# Patient Record
Sex: Male | Born: 1940 | Race: Black or African American | Hispanic: No | Marital: Single | Smoking: Never smoker
Health system: Southern US, Community
[De-identification: ages and names within clinical notes are randomized; demographics above are authoritative.]

## PROBLEM LIST (undated history)

## (undated) DIAGNOSIS — E119 Type 2 diabetes mellitus without complications: Secondary | ICD-10-CM

## (undated) HISTORY — DX: Type 2 diabetes mellitus without complications: E11.9

---

## 2001-03-27 ENCOUNTER — Emergency Department (HOSPITAL_COMMUNITY): Admission: EM | Admit: 2001-03-27 | Discharge: 2001-03-27 | Payer: Self-pay | Admitting: Emergency Medicine

## 2013-05-08 ENCOUNTER — Encounter (HOSPITAL_COMMUNITY): Payer: Self-pay | Admitting: Emergency Medicine

## 2013-05-08 ENCOUNTER — Emergency Department (HOSPITAL_COMMUNITY)
Admission: EM | Admit: 2013-05-08 | Discharge: 2013-05-08 | Disposition: A | Discharge status: Home / Self Care | Payer: No Typology Code available for payment source | Attending: Emergency Medicine | Admitting: Emergency Medicine

## 2013-05-08 DIAGNOSIS — T148XXA Other injury of unspecified body region, initial encounter: Secondary | ICD-10-CM

## 2013-05-08 DIAGNOSIS — Z79899 Other long term (current) drug therapy: Secondary | ICD-10-CM | POA: Insufficient documentation

## 2013-05-08 DIAGNOSIS — Y92410 Unspecified street and highway as the place of occurrence of the external cause: Secondary | ICD-10-CM | POA: Insufficient documentation

## 2013-05-08 DIAGNOSIS — Y9389 Activity, other specified: Secondary | ICD-10-CM | POA: Insufficient documentation

## 2013-05-08 DIAGNOSIS — IMO0002 Reserved for concepts with insufficient information to code with codable children: Secondary | ICD-10-CM | POA: Insufficient documentation

## 2013-05-08 DIAGNOSIS — S4980XA Other specified injuries of shoulder and upper arm, unspecified arm, initial encounter: Secondary | ICD-10-CM | POA: Insufficient documentation

## 2013-05-08 DIAGNOSIS — S46909A Unspecified injury of unspecified muscle, fascia and tendon at shoulder and upper arm level, unspecified arm, initial encounter: Secondary | ICD-10-CM | POA: Insufficient documentation

## 2013-05-08 MED ORDER — HYDROCODONE-ACETAMINOPHEN 5-325 MG PO TABS: 2.0000 | ORAL_TABLET | ORAL | Status: DC | PRN

## 2013-05-08 MED ORDER — METHOCARBAMOL 750 MG PO TABS: 750.0000 mg | ORAL_TABLET | Freq: Four times a day (QID) | ORAL | Status: DC
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
## 2013-05-08 NOTE — ED Notes (Signed)
MD at bedside. 
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
## 2015-08-15 ENCOUNTER — Encounter (HOSPITAL_COMMUNITY): Payer: Self-pay | Admitting: Emergency Medicine

## 2015-08-15 ENCOUNTER — Emergency Department (HOSPITAL_COMMUNITY): Payer: Commercial Managed Care - HMO

## 2015-08-15 ENCOUNTER — Emergency Department (HOSPITAL_COMMUNITY)
Admission: EM | Admit: 2015-08-15 | Discharge: 2015-08-15 | Discharge status: Against Medical Advice | Payer: Commercial Managed Care - HMO | Attending: Physician Assistant | Admitting: Physician Assistant

## 2015-08-15 DIAGNOSIS — H02841 Edema of right upper eyelid: Secondary | ICD-10-CM | POA: Diagnosis not present

## 2015-08-15 DIAGNOSIS — H5789 Other specified disorders of eye and adnexa: Secondary | ICD-10-CM

## 2015-08-15 DIAGNOSIS — H02842 Edema of right lower eyelid: Secondary | ICD-10-CM | POA: Diagnosis not present

## 2015-08-15 DIAGNOSIS — R220 Localized swelling, mass and lump, head: Secondary | ICD-10-CM | POA: Diagnosis present

## 2015-08-15 DIAGNOSIS — R609 Edema, unspecified: Secondary | ICD-10-CM

## 2015-08-15 LAB — BASIC METABOLIC PANEL
Anion gap: 8 (ref 5–15)
BUN: 15 mg/dL (ref 6–20)
CALCIUM: 9.1 mg/dL (ref 8.9–10.3)
CO2: 27 mmol/L (ref 22–32)
CREATININE: 1.04 mg/dL (ref 0.61–1.24)
Chloride: 100 mmol/L — ABNORMAL LOW (ref 101–111)
GFR calc non Af Amer: >60 mL/min (ref >60)
GLUCOSE: 303 mg/dL — AB (ref 65–99)
Potassium: 4.7 mmol/L (ref 3.5–5.1)
Sodium: 135 mmol/L (ref 135–145)

## 2015-08-15 LAB — CBC WITH DIFFERENTIAL/PLATELET
Basophils Absolute: 0 10*3/uL (ref 0.0–0.1)
Basophils Relative: 0 %
Eosinophils Absolute: 0.1 10*3/uL (ref 0.0–0.7)
Eosinophils Relative: 1 %
HCT: 38.2 % — ABNORMAL LOW (ref 39.0–52.0)
Hemoglobin: 13.2 g/dL (ref 13.0–17.0)
Lymphocytes Relative: 38 %
Lymphs Abs: 2.9 10*3/uL (ref 0.7–4.0)
MCH: 27.2 pg (ref 26.0–34.0)
MCHC: 34.6 g/dL (ref 30.0–36.0)
MCV: 78.6 fL (ref 78.0–100.0)
Monocytes Absolute: 0.5 10*3/uL (ref 0.1–1.0)
Monocytes Relative: 7 %
Neutro Abs: 4.1 10*3/uL (ref 1.7–7.7)
Neutrophils Relative %: 54 %
Platelets: 270 10*3/uL (ref 150–400)
RBC: 4.86 MIL/uL (ref 4.22–5.81)
RDW: 13.9 % (ref 11.5–15.5)
WBC: 7.6 10*3/uL (ref 4.0–10.5)

## 2015-08-15 MED ORDER — TETRACAINE HCL 0.5 % OP SOLN
1.0000 [drp] | Freq: Once | OPHTHALMIC | Status: AC
  Administered 2015-08-15: 1 [drp] via OPHTHALMIC
  Filled 2015-08-15: qty 4

## 2015-08-15 MED ORDER — FLUORESCEIN SODIUM 1 MG OP STRP
1.0000 | ORAL_STRIP | Freq: Once | OPHTHALMIC | Status: AC
  Administered 2015-08-15: 1 via OPHTHALMIC
  Filled 2015-08-15: qty 1

## 2015-08-15 MED ORDER — POLYMYXIN B-TRIMETHOPRIM 10000-0.1 UNIT/ML-% OP SOLN: 1.0000 [drp] | OPHTHALMIC | Status: DC

## 2015-08-15 MED ORDER — CLINDAMYCIN HCL 150 MG PO CAPS: 450.0000 mg | ORAL_CAPSULE | Freq: Three times a day (TID) | ORAL | Status: DC

## 2015-08-15 NOTE — ED Provider Notes (Signed)
CSN: [id number]     Arrival date & time 08/15/15  1224 History  By signing my name below, I, Tony Russell, attest that this documentation has been prepared under the direction and in the presence of Tony GemmaElizabeth C Westfall, PA-C. Electronically Signed: Doreatha MartinEva Russell, ED Scribe. 08/15/2015. 1:29 PM.      Chief Complaint  Patient presents with  . Facial Swelling    concerned about possible bug bite in r/eye one week ago    The history is provided by the patient. No language interpreter was used.     HPI Comments: Tony BouillonJeff Claw is a 75 y.o. male with no pertinent PMH d/t not being seen by a PCP for 20 years who presents to the Emergency Department complaining of moderate, worsening upper and lower right eyelid swelling onset 2 days ago with associated erythema onset a week ago, sensation of pressure on his upper eyelid. Patient notes that his symptoms began with focal swelling at his tear duct and have worsened since onset. No h/o of similar symptoms. Pt does not wear glasses or contacts. He denies eye pain, vision changes, eye discharge, photophobia, fever, chills.    History reviewed. No pertinent past medical history. History reviewed. No pertinent past surgical history. Family History  Problem Relation Age of Onset  . Cancer Mother   . Hypertension Father    Social History  Substance Use Topics  . Smoking status: Never Smoker   . Smokeless tobacco: None  . Alcohol Use: No      Review of Systems  Constitutional: Negative for fever and chills.  Eyes: Positive for redness. Negative for photophobia, pain, discharge, itching and visual disturbance.       Upper and lower eyelid swellling  All other systems reviewed and are negative.   Allergies  Review of patient's allergies indicates no known allergies.  Home Medications   Prior to Admission medications   Medication Sig Start Date End Date Taking? Authorizing Provider  clindamycin (CLEOCIN) 150 MG capsule Take 3 capsules (450 mg  total) by mouth 3 (three) times daily. Take 450 mg 3 times daily for 10 days 08/15/15   Tony GemmaElizabeth C Westfall, PA-C  trimethoprim-polymyxin b (POLYTRIM) ophthalmic solution Place 1 drop into the right eye every 4 (four) hours. 08/15/15   Dorise HissElizabeth C Westfall, PA-C    BP 179/86 mmHg  Pulse 80  Temp(Src) 98.2 F (36.8 C) (Oral)  Resp 16  SpO2 98% Physical Exam  Constitutional: He is oriented to person, place, and time. He appears well-developed and well-nourished. No distress.  HENT:  Head: Normocephalic and atraumatic.  Right Ear: External ear normal.  Left Ear: External ear normal.  Nose: Nose normal.  Mouth/Throat: Oropharynx is clear and moist.  Eyes: Conjunctivae and EOM are normal. Pupils are equal, round, and reactive to light. Right eye exhibits discharge and exudate. No foreign body present in the right eye. Left eye exhibits no discharge and no exudate. No foreign body present in the left eye. Right conjunctiva is not injected. Right conjunctiva has no hemorrhage. Left conjunctiva is not injected. Left conjunctiva has no hemorrhage. No scleral icterus. Right eye exhibits normal extraocular motion. Left eye exhibits normal extraocular motion.  Slit lamp exam:      The right eye shows no corneal abrasion, no corneal flare, no corneal ulcer, no foreign body and no fluorescein uptake.       The left eye shows no corneal abrasion, no corneal flare, no corneal ulcer, no foreign body and no fluorescein  uptake.  Erythema and edema to medial aspect of right periorbital region and upper and lower eyelid. No significant TTP.  Neck: Normal range of motion. Neck supple.  Cardiovascular: Normal rate and regular rhythm.   Pulmonary/Chest: Effort normal and breath sounds normal. No respiratory distress.  Musculoskeletal: Normal range of motion. He exhibits no edema or tenderness.  Neurological: He is alert and oriented to person, place, and time.  Skin: Skin is warm and dry. He is not diaphoretic.   Psychiatric: He has a normal mood and affect. His behavior is normal.  Nursing note and vitals reviewed.   ED Course  Procedures (including critical care time)  DIAGNOSTIC STUDIES: Oxygen Saturation is 97% on RA, normal by my interpretation.    COORDINATION OF CARE: 1:28 PM Discussed treatment plan with pt at bedside which includes CT orbits, lab work and pt agreed to plan.  Labs Review Results for orders placed or performed during the hospital encounter of 08/15/15  CBC with Differential  Result Value Ref Range   WBC 7.6 4.0 - 10.5 K/uL   RBC 4.86 4.22 - 5.81 MIL/uL   Hemoglobin 13.2 13.0 - 17.0 g/dL   HCT 21.3 (L) 08.6 - 57.8 %   MCV 78.6 78.0 - 100.0 fL   MCH 27.2 26.0 - 34.0 pg   MCHC 34.6 30.0 - 36.0 g/dL   RDW 46.9 62.9 - 52.8 %   Platelets 270 150 - 400 K/uL   Neutrophils Relative % 54 %   Neutro Abs 4.1 1.7 - 7.7 K/uL   Lymphocytes Relative 38 %   Lymphs Abs 2.9 0.7 - 4.0 K/uL   Monocytes Relative 7 %   Monocytes Absolute 0.5 0.1 - 1.0 K/uL   Eosinophils Relative 1 %   Eosinophils Absolute 0.1 0.0 - 0.7 K/uL   Basophils Relative 0 %   Basophils Absolute 0.0 0.0 - 0.1 K/uL  Basic metabolic panel  Result Value Ref Range   Sodium 135 135 - 145 mmol/L   Potassium 4.7 3.5 - 5.1 mmol/L   Chloride 100 (L) 101 - 111 mmol/L   CO2 27 22 - 32 mmol/L   Glucose, Bld 303 (H) 65 - 99 mg/dL   BUN 15 6 - 20 mg/dL   Creatinine, Ser 4.13 0.61 - 1.24 mg/dL   Calcium 9.1 8.9 - 24.4 mg/dL   GFR calc non Af Amer >60 >60 mL/min   GFR calc Af Amer >60 >60 mL/min   Anion gap 8 5 - 15     Imaging Review No results found.    I have personally reviewed and evaluated these lab results as part of my medical decision-making.   MDM   Final diagnoses:  Swelling of right eye   75 year old male presents with right eyelid swelling. Notes his symptoms have been present for the past week. Patient is afebrile. Vital signs stable. On exam, patient has significant edema and erythema to  his right upper and lower eyelid. PERRL. EOMs intact. No conjunctival injection. No increased fluorescein uptake on exam. Patient discussed with and seen by Dr. Corlis Leak. Will obtain basic labs as well as CT of orbits for further evaluation of symptoms. CBC negative for leukocytosis or anemia. Creatinine within normal limits.  Notified by nurse that patient is refusing his CT and states he has to go to work. On my assessment, patient continues to refuse CT despite discussing importance of obtaining this imaging. Spoke with patient at length regarding risks of not obtaining CT, which includes loss  of vision and worsening infection. Patient states he understands the risks of leaving and continues to express his desire to leave the ED AMA. Will treat with oral abx as well as eyedrops. Patient to follow-up with ophthalmologist. Strict return precautions discussed. Patient verbalizes his understanding. BP 179/86 mmHg  Pulse 80  Temp(Src) 98.2 F (36.8 C) (Oral)  Resp 16  SpO2 98%   I personally performed the services described in this documentation, which was scribed in my presence. The recorded information has been reviewed and is accurate.   Tony Gemma, PA-C 08/15/15 1748  Courteney Randall An, MD 08/16/15 1649

## 2015-08-15 NOTE — Discharge Instructions (Signed)
1. Medications: clindamycin, polytrim eye drops, usual home medications 2. Treatment: rest, drink plenty of fluids 3. Follow Up: please followup with your ophthalmologist in 2-3 days for discussion of your diagnoses and further evaluation after today's visit; if you do not have a primary care doctor use the phone number listed in your discharge paperwork to find one; please return to the ER for increased pain, vision loss, high fever, new or worsening symptoms

## 2015-08-15 NOTE — ED Notes (Signed)
Patient states he is unable to wait for CT scan and has to go to work.  PA notified.

## 2017-01-19 ENCOUNTER — Encounter (HOSPITAL_COMMUNITY): Payer: Self-pay | Admitting: Emergency Medicine

## 2017-01-19 ENCOUNTER — Emergency Department (HOSPITAL_COMMUNITY)
Admission: EM | Admit: 2017-01-19 | Discharge: 2017-01-19 | Disposition: A | Discharge status: Home / Self Care | Payer: Medicare HMO | Attending: Emergency Medicine | Admitting: Emergency Medicine

## 2017-01-19 ENCOUNTER — Emergency Department (HOSPITAL_COMMUNITY): Payer: Medicare HMO

## 2017-01-19 DIAGNOSIS — M7918 Myalgia, other site: Secondary | ICD-10-CM

## 2017-01-19 DIAGNOSIS — M545 Low back pain: Secondary | ICD-10-CM | POA: Insufficient documentation

## 2017-01-19 DIAGNOSIS — Z041 Encounter for examination and observation following transport accident: Secondary | ICD-10-CM | POA: Diagnosis not present

## 2017-01-19 NOTE — Discharge Instructions (Signed)
Return if any problems. See your Physician for recheck if pain persist  
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
## 2017-01-19 NOTE — ED Notes (Signed)
PT states understanding of care given. PT ambulated from ED to car with a steady gait. 
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
## 2017-01-19 NOTE — ED Notes (Signed)
Patient is A&Ox4.  No signs of distress noted.  Please see providers complete history and physical exam.  

## 2018-10-05 DIAGNOSIS — E669 Obesity, unspecified: Secondary | ICD-10-CM | POA: Diagnosis not present

## 2018-10-05 DIAGNOSIS — Z833 Family history of diabetes mellitus: Secondary | ICD-10-CM | POA: Diagnosis not present

## 2018-10-05 DIAGNOSIS — Z8249 Family history of ischemic heart disease and other diseases of the circulatory system: Secondary | ICD-10-CM | POA: Diagnosis not present

## 2018-10-05 DIAGNOSIS — Z809 Family history of malignant neoplasm, unspecified: Secondary | ICD-10-CM | POA: Diagnosis not present

## 2018-10-05 DIAGNOSIS — Z87891 Personal history of nicotine dependence: Secondary | ICD-10-CM | POA: Diagnosis not present

## 2019-06-10 ENCOUNTER — Emergency Department (HOSPITAL_COMMUNITY): Payer: Medicare HMO

## 2019-06-10 ENCOUNTER — Inpatient Hospital Stay (HOSPITAL_COMMUNITY)
Admission: EM | Admit: 2019-06-10 | Discharge: 2019-06-14 | DRG: 177 | Disposition: A | Discharge status: Home / Self Care | Payer: Medicare HMO | Attending: Family Medicine | Admitting: Family Medicine

## 2019-06-10 ENCOUNTER — Encounter (HOSPITAL_COMMUNITY): Payer: Self-pay

## 2019-06-10 ENCOUNTER — Other Ambulatory Visit: Payer: Self-pay

## 2019-06-10 DIAGNOSIS — J9601 Acute respiratory failure with hypoxia: Secondary | ICD-10-CM | POA: Diagnosis present

## 2019-06-10 DIAGNOSIS — I444 Left anterior fascicular block: Secondary | ICD-10-CM | POA: Diagnosis present

## 2019-06-10 DIAGNOSIS — Z8249 Family history of ischemic heart disease and other diseases of the circulatory system: Secondary | ICD-10-CM

## 2019-06-10 DIAGNOSIS — U071 COVID-19: Secondary | ICD-10-CM | POA: Diagnosis present

## 2019-06-10 DIAGNOSIS — J1282 Pneumonia due to coronavirus disease 2019: Secondary | ICD-10-CM | POA: Diagnosis present

## 2019-06-10 DIAGNOSIS — E663 Overweight: Secondary | ICD-10-CM | POA: Diagnosis present

## 2019-06-10 DIAGNOSIS — J9602 Acute respiratory failure with hypercapnia: Secondary | ICD-10-CM | POA: Diagnosis present

## 2019-06-10 DIAGNOSIS — E871 Hypo-osmolality and hyponatremia: Secondary | ICD-10-CM | POA: Diagnosis present

## 2019-06-10 DIAGNOSIS — Z6838 Body mass index (BMI) 38.0-38.9, adult: Secondary | ICD-10-CM | POA: Diagnosis not present

## 2019-06-10 DIAGNOSIS — E1165 Type 2 diabetes mellitus with hyperglycemia: Secondary | ICD-10-CM | POA: Diagnosis present

## 2019-06-10 LAB — COMPREHENSIVE METABOLIC PANEL
ALT: 20 U/L (ref 0–44)
AST: 26 U/L (ref 15–41)
Albumin: 3.3 g/dL — ABNORMAL LOW (ref 3.5–5.0)
Alkaline Phosphatase: 68 U/L (ref 38–126)
Anion gap: 15 (ref 5–15)
BUN: 16 mg/dL (ref 8–23)
CO2: 22 mmol/L (ref 22–32)
Calcium: 8.2 mg/dL — ABNORMAL LOW (ref 8.9–10.3)
Chloride: 90 mmol/L — ABNORMAL LOW (ref 98–111)
Creatinine, Ser: 1.12 mg/dL (ref 0.61–1.24)
GFR calc Af Amer: >60 mL/min (ref >60)
GFR calc non Af Amer: >60 mL/min (ref >60)
Glucose, Bld: 311 mg/dL — ABNORMAL HIGH (ref 70–99)
Potassium: 4.1 mmol/L (ref 3.5–5.1)
Sodium: 127 mmol/L — ABNORMAL LOW (ref 135–145)
Total Bilirubin: 0.7 mg/dL (ref 0.3–1.2)
Total Protein: 7.5 g/dL (ref 6.5–8.1)

## 2019-06-10 LAB — CBC WITH DIFFERENTIAL/PLATELET
Abs Immature Granulocytes: 0.02 10*3/uL (ref 0.00–0.07)
Basophils Absolute: 0 10*3/uL (ref 0.0–0.1)
Basophils Relative: 0 %
Eosinophils Absolute: 0 10*3/uL (ref 0.0–0.5)
Eosinophils Relative: 0 %
HCT: 39.7 % (ref 39.0–52.0)
Hemoglobin: 13.3 g/dL (ref 13.0–17.0)
Immature Granulocytes: 0 %
Lymphocytes Relative: 27 %
Lymphs Abs: 1.4 10*3/uL (ref 0.7–4.0)
MCH: 26.7 pg (ref 26.0–34.0)
MCHC: 33.5 g/dL (ref 30.0–36.0)
MCV: 79.7 fL — ABNORMAL LOW (ref 80.0–100.0)
Monocytes Absolute: 0.4 10*3/uL (ref 0.1–1.0)
Monocytes Relative: 8 %
Neutro Abs: 3.5 10*3/uL (ref 1.7–7.7)
Neutrophils Relative %: 65 %
Platelets: 242 10*3/uL (ref 150–400)
RBC: 4.98 MIL/uL (ref 4.22–5.81)
RDW: 14.1 % (ref 11.5–15.5)
WBC: 5.4 10*3/uL (ref 4.0–10.5)
nRBC: 0 % (ref 0.0–0.2)

## 2019-06-10 LAB — D-DIMER, QUANTITATIVE: D-Dimer, Quant: 1.02 ug/mL-FEU — ABNORMAL HIGH (ref 0.00–0.50)

## 2019-06-10 LAB — LACTIC ACID, PLASMA: Lactic Acid, Venous: 1.7 mmol/L (ref 0.5–1.9)

## 2019-06-10 LAB — TRIGLYCERIDES: Triglycerides: 177 mg/dL — ABNORMAL HIGH (ref <150)

## 2019-06-10 LAB — ABO/RH: ABO/RH(D): AB POS

## 2019-06-10 LAB — LACTATE DEHYDROGENASE: LDH: 305 U/L — ABNORMAL HIGH (ref 98–192)

## 2019-06-10 LAB — PROCALCITONIN: Procalcitonin: 0.23 ng/mL

## 2019-06-10 LAB — FIBRINOGEN: Fibrinogen: 486 mg/dL — ABNORMAL HIGH (ref 210–475)

## 2019-06-10 LAB — POC SARS CORONAVIRUS 2 AG -  ED: SARS Coronavirus 2 Ag: POSITIVE — AB

## 2019-06-10 MED ORDER — ALBUTEROL SULFATE HFA 108 (90 BASE) MCG/ACT IN AERS
2.0000 | INHALATION_SPRAY | Freq: Four times a day (QID) | RESPIRATORY_TRACT | Status: DC
  Administered 2019-06-10 – 2019-06-14 (×16): 2 via RESPIRATORY_TRACT
  Filled 2019-06-10 (×4): qty 6.7

## 2019-06-10 MED ORDER — ACETAMINOPHEN 325 MG PO TABS: 650.0000 mg | ORAL_TABLET | Freq: Four times a day (QID) | ORAL | Status: DC | PRN

## 2019-06-10 MED ORDER — ONDANSETRON HCL 4 MG/2ML IJ SOLN: 4.0000 mg | Freq: Four times a day (QID) | INTRAMUSCULAR | Status: DC | PRN

## 2019-06-10 MED ORDER — SODIUM CHLORIDE 0.9 % IV SOLN
100.0000 mg | Freq: Every day | INTRAVENOUS | Status: AC
  Administered 2019-06-11 – 2019-06-14 (×4): 100 mg via INTRAVENOUS
  Filled 2019-06-10 (×3): qty 100
  Filled 2019-06-10: qty 20

## 2019-06-10 MED ORDER — ENOXAPARIN SODIUM 60 MG/0.6ML ~~LOC~~ SOLN
55.0000 mg | SUBCUTANEOUS | Status: DC
  Administered 2019-06-11 – 2019-06-13 (×4): 55 mg via SUBCUTANEOUS
  Filled 2019-06-10: qty 0.55
  Filled 2019-06-10 (×3): qty 0.6
  Filled 2019-06-10 (×2): qty 0.55

## 2019-06-10 MED ORDER — SODIUM CHLORIDE 0.9 % IV SOLN
200.0000 mg | Freq: Once | INTRAVENOUS | Status: AC
  Administered 2019-06-10: 200 mg via INTRAVENOUS
  Filled 2019-06-10: qty 200

## 2019-06-10 MED ORDER — ONDANSETRON HCL 4 MG PO TABS: 4.0000 mg | ORAL_TABLET | Freq: Four times a day (QID) | ORAL | Status: DC | PRN

## 2019-06-10 MED ORDER — SORBITOL 70 % SOLN
30.0000 mL | Freq: Every day | Status: DC | PRN
  Filled 2019-06-10: qty 30

## 2019-06-10 MED ORDER — IOHEXOL 350 MG/ML SOLN
100.0000 mL | Freq: Once | INTRAVENOUS | Status: AC | PRN
  Administered 2019-06-10: 100 mL via INTRAVENOUS

## 2019-06-10 MED ORDER — ACETAMINOPHEN 650 MG RE SUPP: 650.0000 mg | Freq: Four times a day (QID) | RECTAL | Status: DC | PRN

## 2019-06-10 MED ORDER — ZINC SULFATE 220 (50 ZN) MG PO CAPS
220.0000 mg | ORAL_CAPSULE | Freq: Every day | ORAL | Status: DC
  Administered 2019-06-10 – 2019-06-14 (×5): 220 mg via ORAL
  Filled 2019-06-10 (×5): qty 1

## 2019-06-10 MED ORDER — ASCORBIC ACID 500 MG PO TABS
500.0000 mg | ORAL_TABLET | Freq: Every day | ORAL | Status: DC
  Administered 2019-06-10 – 2019-06-14 (×5): 500 mg via ORAL
  Filled 2019-06-10 (×6): qty 1

## 2019-06-10 MED ORDER — HYDROCOD POLST-CPM POLST ER 10-8 MG/5ML PO SUER
5.0000 mL | Freq: Two times a day (BID) | ORAL | Status: DC | PRN
  Administered 2019-06-13: 5 mL via ORAL
  Filled 2019-06-10: qty 5

## 2019-06-10 MED ORDER — FLEET ENEMA 7-19 GM/118ML RE ENEM: 1.0000 | ENEMA | Freq: Once | RECTAL | Status: DC | PRN

## 2019-06-10 MED ORDER — MAGNESIUM HYDROXIDE 400 MG/5ML PO SUSP: 30.0000 mL | Freq: Every day | ORAL | Status: DC | PRN

## 2019-06-10 MED ORDER — SODIUM CHLORIDE 0.9% FLUSH
3.0000 mL | Freq: Two times a day (BID) | INTRAVENOUS | Status: DC
  Administered 2019-06-11 – 2019-06-14 (×8): 3 mL via INTRAVENOUS

## 2019-06-10 MED ORDER — DEXAMETHASONE SODIUM PHOSPHATE 10 MG/ML IJ SOLN
6.0000 mg | INTRAMUSCULAR | Status: DC
  Administered 2019-06-10 – 2019-06-13 (×4): 6 mg via INTRAVENOUS
  Filled 2019-06-10 (×5): qty 1

## 2019-06-10 MED ORDER — GUAIFENESIN-DM 100-10 MG/5ML PO SYRP
10.0000 mL | ORAL_SOLUTION | ORAL | Status: DC | PRN
  Administered 2019-06-13 (×2): 10 mL via ORAL
  Filled 2019-06-10 (×2): qty 10

## 2019-06-10 MED ORDER — SENNA 8.6 MG PO TABS
1.0000 | ORAL_TABLET | Freq: Two times a day (BID) | ORAL | Status: DC
  Administered 2019-06-11 – 2019-06-14 (×7): 8.6 mg via ORAL
  Filled 2019-06-10 (×8): qty 1

## 2019-06-10 MED ORDER — ENOXAPARIN SODIUM 40 MG/0.4ML ~~LOC~~ SOLN: 40.0000 mg | SUBCUTANEOUS | Status: DC

## 2019-06-10 MED ORDER — SODIUM CHLORIDE (PF) 0.9 % IJ SOLN
INTRAMUSCULAR | Status: AC
  Filled 2019-06-10: qty 50

## 2019-06-10 NOTE — ED Notes (Signed)
Upon rounding on pt, pt found covered in BM. Pt states he was unaware he had a bowel movement. Pt cleaned and provided with peri care. Pt has call bell, room phone and cell phone within reach. Pt states he is going to call his family who called while pt was being cleaned.

## 2019-06-10 NOTE — Progress Notes (Signed)
PHARMACY - LOVENOX  Lovenox per Pharmacy for DVT Prophylaxis  Pharmacy has been consulted from dosing enoxaparin (lovenox) in this patient for DVT prophylaxis.     COVID +  H/H = 13.3/39.7  PLTC = 242  BMI = 38  Assessment:  BMI > 35 therefore will adjust Lovenox dose per guideline to 0.5 mg/kg daily  Plan: Lovenox 55mg  sq q24h  Thank you , PharmD

## 2019-06-10 NOTE — ED Notes (Signed)
Pt cleaned up by staff members. Pt stated he was unaware that he had a loose bowel movement. Bed is in locked and lowest position. Call bell within reach.

## 2019-06-10 NOTE — H&P (Signed)
[facility] History and Physical  Tony Mcpartland Winkels Jr. [id number]:[id number] DOB: Dec 01, 1940 DOA: 06/10/2019 Referring physician: ED PCP: Patient, No Pcp Per  Chief Complaint: Cough, fatigue Came from: Home ------------------------------------------------------------------------------------------------------ Assessment/Plan: Active Problems:   Pneumonia due to COVID-19 virus  COVID pneumonia Acute respiratory failure with hypoxia - 2 lpm -Presented with cough, fatigue -Chest imaging -CT chest showed bibasilar opacities -COVID-19 positive -WBC and inflammatory markers as below. -Treatment: Started on Decadron 6 mg daily for 10 days, IV Remdesivir for 5 days to complete on 1/9, -Supportive care: Vitamin C, Zinc, inhalers, Tylenol, Antitussives - benzonatate, Mucinex -Oxygen - SpO2: 99 % O2 Flow Rate (L/min): 3 L/min -Continue airborne/contact isolation precautions.  Recent Labs  Lab 06/10/19 1320  WBC 5.4   Recent Labs    06/10/19 1320  DDIMER 1.02*  LDH 305*    Hyponatremia -Sodium low at 127.  Patient denies any past medical history.  Last labs from 2017 shows sodium level normal at 135. -Likely from poor hydration and oral intake.  Encourage oral hydration intake.  Repeat sodium tomorrow.  If not improving, will start on IV fluids.  Mobility: Independent.  Increase ambulation Diet: Regular diet DVT prophylaxis:  Lovenox Code Status:  Full code Family Communication:  Patient states he will update the family Disposition Plan:  Inpatient admission.  If improves, an early discharge to follow-up at remdesivir clinic may be appropriate.  ----------------------------------------------------------------------------------------------------- History of Present Illness: Tony Dines. is a 79 y.o. male with no significant past medical history who presented to the ED with cough, fatigue.  Patient works as a Scientist, [place] and recalls being  exposed to a patient with Covid 10 days ago.  Started having cough few days after the exposure.  It is dry and 'deep-seated'.  He also started having generalized body aches and fatigue which has been progressing.   In the ED, patient was afebrile, hemodynamically stable.  He was initially maintained oxygen saturation more than 90% on room air at rest.  On minimal ambulation, patient started to drop oxygen saturation to 80s requiring supplemental oxygen.  At the time of my evaluation, patient is on 2 L oxygen to maintain saturation more than 90%. Work-up showed WBC count normal at 5.4, sodium level low at 127, BUN/creatinine normal. Lactic acid normal 1.7, procalcitonin slightly elevated to 0.23 COVID-19 antigen test positive. Serum inflammatory markers elevated: LDH 305, D-dimer 1.02, fibrinogen 486. Chest x-ray normal CT angio of chest did not show any evidence of pulmonary embolism but showed bilateral groundglass opacities.  Past medical history: History reviewed. No pertinent past medical history.  Past surgical history: History reviewed. No pertinent surgical history.  Social History:  reports that he has never smoked. He has never used smokeless tobacco. He reports that he does not drink alcohol or use drugs.  Allergies:  No Known Allergies  Family history:  Family History  Problem Relation Age of Onset  . Cancer Mother   . Hypertension Father      Home Meds: Prior to Admission medications   Medication Sig Start Date End Date Taking? Authorizing Provider  clindamycin (CLEOCIN) 150 MG capsule Take 3 capsules (450 mg total) by mouth 3 (three) times daily. Take 450 mg 3 times daily for 10 days Patient not taking: Reported on 06/10/2019 08/15/15   Mady Gemma, PA-C  trimethoprim-polymyxin b (POLYTRIM) ophthalmic solution Place 1 drop into the right eye every 4 (four) hours. Patient not taking: Reported on 01/19/2017 08/15/15  Mady Gemma, [place]    Physical  Exam: Vitals:   06/10/19 1300 06/10/19 1358 06/10/19 1543 06/10/19 1600  BP: 127/75 138/69 (!) 150/98 (!) 115/93  Pulse: 95 93 95 99  Resp: (!) 26 (!) 24 (!) 38 (!) 38  Temp:      TempSrc:      SpO2: 92% 97% 99% 99%  Weight:      Height:       Wt Readings from Last 3 Encounters:  06/10/19 113.4 kg  01/19/17 119.6 kg  05/08/13 102.1 kg   Body mass index is 38.01 kg/m.  General exam: Appears calm and comfortable.  Skin: No rashes, lesions or ulcers. HEENT: Atraumatic, normocephalic, supple neck, no obvious bleeding Lungs: Clear to auscultation bilaterally CVS: Regular rate and rhythm, no murmur GI/Abd soft, nontender, nondistended, bowel sound present CNS: Alert, awake, oriented x3 Psychiatry: Mood appropriate Extremities: No pedal edema, no calf tenderness     Consult Orders  (From admission, onward)         Start     Ordered   06/10/19 1553  Consult to hospitalist  ALL PATIENTS BEING ADMITTED/HAVING PROCEDURES NEED COVID-19 SCREENING  Once    Comments: ALL PATIENTS BEING ADMITTED/HAVING PROCEDURES NEED COVID-19 SCREENING  Provider:  (Not yet assigned)  Question Answer Comment  Place call to: [facility]   Reason for Consult Admit      06/10/19 1552          Labs on Admission:   CBC: Recent Labs  Lab 06/10/19 1320  WBC 5.4  NEUTROABS 3.5  HGB 13.3  HCT 39.7  MCV 79.7*  PLT 242    Basic Metabolic Panel: Recent Labs  Lab 06/10/19 1320  NA 127*  K 4.1  CL 90*  CO2 22  GLUCOSE 311*  BUN 16  CREATININE 1.12  CALCIUM 8.2*    Liver Function Tests: Recent Labs  Lab 06/10/19 1320  AST 26  ALT 20  ALKPHOS 68  BILITOT 0.7  PROT 7.5  ALBUMIN 3.3*   No results for input(s): LIPASE, AMYLASE in the last 168 hours. No results for input(s): AMMONIA in the last 168 hours.  Cardiac Enzymes: No results for input(s): CKTOTAL, CKMB, CKMBINDEX, TROPONINI in the last 168 hours.  BNP (last 3 results) No results for input(s): BNP in the last  8760 hours.  ProBNP (last 3 results) No results for input(s): PROBNP in the last 8760 hours.  CBG: No results for input(s): GLUCAP in the last 168 hours.  Lipase  No results found for: LIPASE   Urinalysis No results found for: COLORURINE, APPEARANCEUR, LABSPEC, PHURINE, GLUCOSEU, HGBUR, BILIRUBINUR, KETONESUR, PROTEINUR, UROBILINOGEN, NITRITE, LEUKOCYTESUR   Drugs of Abuse  No results found for: LABOPIA, COCAINSCRNUR, LABBENZ, AMPHETMU, THCU, LABBARB    Radiological Exams on Admission: CT Angio Chest PE W and/or Wo Contrast  Result Date: 06/10/2019 CLINICAL DATA:  Shortness of breath and cough, history of COVID-19 positivity EXAM: CT ANGIOGRAPHY CHEST WITH CONTRAST TECHNIQUE: Multidetector CT imaging of the chest was performed using the standard protocol during bolus administration of intravenous contrast. Multiplanar CT image reconstructions and MIPs were obtained to evaluate the vascular anatomy. CONTRAST:  OMNIPAQUE IOHEXOL 350 MG/ML SOLN COMPARISON:  Chest x-ray from earlier in the same day. FINDINGS: Cardiovascular: Thoracic aorta demonstrates atherosclerotic calcifications without aneurysmal dilatation or dissection. No cardiac enlargement is seen. The pulmonary artery shows a normal branching pattern. No definitive filling defect to suggest pulmonary embolism is seen although peripheral opacification in the  lower lobes is somewhat limited. Mediastinum/Nodes: Thoracic inlet is within normal limits. No sizable hilar or mediastinal adenopathy is noted. The esophagus as visualized shows a small sliding-type hiatal hernia. No other focal abnormality is seen. Lungs/Pleura: Calcified granulomas are seen. Additionally some peripheral ground-glass opacities are noted within both lungs consistent with the given clinical history. No sizable effusion or pneumothorax is noted. No sizable parenchymal nodules are seen. Upper Abdomen: Visualized upper abdomen is within normal limits. Musculoskeletal:  Mild degenerative changes of the thoracic spine are noted. Review of the MIP images confirms the above findings. IMPRESSION: Bilateral ground-glass opacities consistent with the given clinical history of COVID-19 positivity. No evidence of pulmonary emboli. Electronically Signed   By: Inez Catalina M.D.   On: 06/10/2019 15:40   DG Chest Portable 1 View  Result Date: 06/10/2019 CLINICAL DATA:  Shortness of breath. Additional history provided: Generalized body aches and fatigue on Friday, now coughing with shortness of breath. EXAM: PORTABLE CHEST 1 VIEW COMPARISON:  No pertinent prior studies available for comparison. FINDINGS: Heart size within normal limits. No airspace consolidation is identified. No evidence of pleural effusion or pneumothorax. No acute bony abnormality. Overlying cardiac monitoring leads. IMPRESSION: No evidence of acute cardiopulmonary abnormality. Electronically Signed   By: Kellie Simmering DO   On: 06/10/2019 13:49   ----------------------------------------------------------------------------------------------------------------------------------------------------------- Severity of Illness: The appropriate patient status for this patient is INPATIENT. Inpatient status is judged to be reasonable and necessary in order to provide the required intensity of service to ensure the patient's safety. The patient's presenting symptoms, physical exam findings, and initial radiographic and laboratory data in the context of their chronic comorbidities is felt to place them at high risk for further clinical deterioration. Furthermore, it is not anticipated that the patient will be medically stable for discharge from the hospital within 2 midnights of admission. The following factors support the patient status of inpatient.   " The patient's presenting symptoms include cough, fatigue. " The worrisome physical exam findings include hypoxia. " The initial radiographic and laboratory data are worrisome  because of bilateral opacities, Covid positive. " The chronic co-morbidities include all days.   * I certify that at the point of admission it is my clinical judgment that the patient will require inpatient hospital care spanning beyond 2 midnights from the point of admission due to high intensity of service, high risk for further deterioration and high frequency of surveillance required.*   Signed, Terrilee Croak, MD [facility] 06/10/2019

## 2019-06-10 NOTE — ED Notes (Signed)
Pt called out stating he felt like he had another BM. Upon entering room, pt found attempting to get out of the bed to use the bathroom. Pt repositioned in the bed and offered toileting, pt denied needing to have a BM at this time. Pt now has fall alarm pad underneath him due to pt attempting to get out of bed without assist. Pt has call bell within reach and encouraged to use it if assistance is needed.

## 2019-06-10 NOTE — ED Triage Notes (Signed)
Pt reports he started having generalized body aches and fatigue on Friday. Pt is now coughing and SHOB. Pt reports getting a flu vaccine on Monday as well. Pt [place] so he thinks he was exposed at work.

## 2019-06-10 NOTE — ED Provider Notes (Signed)
[facility]-EMERGENCY DEPT Provider Note   CSN: [id number] Arrival date & time: 06/10/19  1222     History Chief Complaint  Patient presents with  . Generalized Body Aches  . Cough  . Shortness of Breath    Tony Russell. is a 79 y.o. male with no significant past medical history who presents to the ED due to gradual onset of worsening generalized body aches and fatigue that started on Friday. 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 He notes he developed a dry cough and chills on Monday. Patient notes he received his flu vaccine on Monday. Patient denies fever, shortness of breath, and chest pain. Patient has tried Tylenol with moderate relief. Patient denies lower extremity edema, nausea, vomiting and diarrhea. He admits to intermittent abdominal pain only while coughing. Patient denies known COVID exposures or sick contacts.      History reviewed. No pertinent past medical history.  Patient Active Problem List   Diagnosis Date Noted  . Pneumonia due to COVID-19 virus 06/10/2019    History reviewed. No pertinent surgical history.     Family History  Problem Relation Age of Onset  . Cancer Mother   . Hypertension Father     Social History   Tobacco Use  . Smoking status: Never Smoker  . Smokeless tobacco: Never Used  Substance Use Topics  . Alcohol use: No  . Drug use: No    Frequency: 3.0 times per week    Home Medications Prior to Admission medications   Medication Sig Start Date End Date Taking? Authorizing Provider  clindamycin (CLEOCIN) 150 MG capsule Take 3 capsules (450 mg total) by mouth 3 (three) times daily. Take 450 mg 3 times daily for 10 days Patient not taking: Reported on 06/10/2019 08/15/15   Mady Gemma, PA-C  trimethoprim-polymyxin b (POLYTRIM) ophthalmic solution Place 1 drop into the right eye every 4 (four) hours. Patient not taking: Reported on 01/19/2017  08/15/15   Mady Gemma, PA-C    Allergies    Patient has no known allergies.  Review of Systems   Review of Systems  Constitutional: Positive for chills and fatigue. Negative for fever.  HENT: Negative for congestion and sore throat.   Respiratory: Positive for cough. Negative for shortness of breath.   Cardiovascular: Negative for chest pain and leg swelling.  Gastrointestinal: Positive for abdominal pain (when coughing). Negative for constipation, diarrhea, nausea and vomiting.  Musculoskeletal: Positive for myalgias.  Neurological: Positive for light-headedness (when ambulating). Negative for syncope, weakness and headaches.  All other systems reviewed and are negative.   Physical Exam Updated Vital Signs BP (!) 141/78   Pulse 99   Temp 99 F (37.2 C) (Oral)   Resp 16   Ht 5\' 8"  (1.727 m)   Wt 113.4 kg   SpO2 94%   BMI 38.01 kg/m   Physical Exam Vitals and nursing note reviewed.  Constitutional:      General: He is not in acute distress.    Appearance: He is ill-appearing.  HENT:     Head: Normocephalic.     Mouth/Throat:     Pharynx: No oropharyngeal exudate or posterior oropharyngeal erythema.     Comments: Dry mucus membranes.  Eyes:     Conjunctiva/sclera: Conjunctivae normal.     Pupils: Pupils are equal, round, and reactive to light.  Neck:     Comments: No meningismus. Cardiovascular:  Rate and Rhythm: Normal rate and regular rhythm.     Pulses: Normal pulses.     Heart sounds: Normal heart sounds. No murmur. No friction rub. No gallop.   Pulmonary:     Effort: Pulmonary effort is normal.     Breath sounds: Normal breath sounds.     Comments: Respirations equal with mild increased work of breathing, patient able to speak in full sentences, lungs clear to auscultation bilaterally with mild decreased air movement at base of lungs. Patient dropped down to 86% O2 saturation after ambulation. Patient placed on 2L [place] Abdominal:     General:  Abdomen is flat. Bowel sounds are normal. There is no distension.     Palpations: Abdomen is soft.  Musculoskeletal:     Cervical back: Neck supple.     Right lower leg: No edema.     Left lower leg: No edema.     Comments: Patient able to move all 4 extremities without difficulty. No lower extremity edema.   Skin:    General: Skin is warm.  Neurological:     General: No focal deficit present.     Mental Status: He is alert.     ED Results / Procedures / Treatments   Labs (all labs ordered are listed, but only abnormal results are displayed) Labs Reviewed  CBC WITH DIFFERENTIAL/PLATELET - Abnormal; Notable for the following components:      Result Value   MCV 79.7 (*)    All other components within normal limits  COMPREHENSIVE METABOLIC PANEL - Abnormal; Notable for the following components:   Sodium 127 (*)    Chloride 90 (*)    Glucose, Bld 311 (*)    Calcium 8.2 (*)    Albumin 3.3 (*)    All other components within normal limits  D-DIMER, QUANTITATIVE (NOT AT [facility]) - Abnormal; Notable for the following components:   D-Dimer, Quant 1.02 (*)    All other components within normal limits  LACTATE DEHYDROGENASE - Abnormal; Notable for the following components:   LDH 305 (*)    All other components within normal limits  TRIGLYCERIDES - Abnormal; Notable for the following components:   Triglycerides 177 (*)    All other components within normal limits  FIBRINOGEN - Abnormal; Notable for the following components:   Fibrinogen 486 (*)    All other components within normal limits  POC SARS CORONAVIRUS 2 AG -  ED - Abnormal; Notable for the following components:   SARS Coronavirus 2 Ag POSITIVE (*)    All other components within normal limits  CULTURE, BLOOD (ROUTINE X 2)  CULTURE, BLOOD (ROUTINE X 2)  LACTIC ACID, PLASMA  PROCALCITONIN  FERRITIN  C-REACTIVE PROTEIN  BASIC METABOLIC PANEL  CBC  D-DIMER, QUANTITATIVE (NOT AT [facility])  FERRITIN  C-REACTIVE PROTEIN  ABO/RH      EKG None  Radiology CT Angio Chest PE W and/or Wo Contrast  Result Date: 06/10/2019 CLINICAL DATA:  Shortness of breath and cough, history of COVID-19 positivity EXAM: CT ANGIOGRAPHY CHEST WITH CONTRAST TECHNIQUE: Multidetector CT imaging of the chest was performed using the standard protocol during bolus administration of intravenous contrast. Multiplanar CT image reconstructions and MIPs were obtained to evaluate the vascular anatomy. CONTRAST:  OMNIPAQUE IOHEXOL 350 MG/ML SOLN COMPARISON:  Chest x-ray from earlier in the same day. FINDINGS: Cardiovascular: Thoracic aorta demonstrates atherosclerotic calcifications without aneurysmal dilatation or dissection. No cardiac enlargement is seen. The pulmonary artery shows a normal branching pattern. No definitive filling  defect to suggest pulmonary embolism is seen although peripheral opacification in the lower lobes is somewhat limited. Mediastinum/Nodes: Thoracic inlet is within normal limits. No sizable hilar or mediastinal adenopathy is noted. The esophagus as visualized shows a small sliding-type hiatal hernia. No other focal abnormality is seen. Lungs/Pleura: Calcified granulomas are seen. Additionally some peripheral ground-glass opacities are noted within both lungs consistent with the given clinical history. No sizable effusion or pneumothorax is noted. No sizable parenchymal nodules are seen. Upper Abdomen: Visualized upper abdomen is within normal limits. Musculoskeletal: Mild degenerative changes of the thoracic spine are noted. Review of the MIP images confirms the above findings. IMPRESSION: Bilateral ground-glass opacities consistent with the given clinical history of COVID-19 positivity. No evidence of pulmonary emboli. Electronically Signed   By: Alcide Clever M.D.   On: 06/10/2019 15:40   DG Chest Portable 1 View  Result Date: 06/10/2019 CLINICAL DATA:  Shortness of breath. Additional history provided: Generalized body aches and  fatigue on Friday, now coughing with shortness of breath. EXAM: PORTABLE CHEST 1 VIEW COMPARISON:  No pertinent prior studies available for comparison. FINDINGS: Heart size within normal limits. No airspace consolidation is identified. No evidence of pleural effusion or pneumothorax. No acute bony abnormality. Overlying cardiac monitoring leads. IMPRESSION: No evidence of acute cardiopulmonary abnormality. Electronically Signed   By: Jackey Loge DO   On: 06/10/2019 13:49    Procedures Procedures (including critical care time)  Medications Ordered in ED Medications  sodium chloride (PF) 0.9 % injection (has no administration in time range)  sodium chloride flush (NS) 0.9 % injection 3 mL (has no administration in time range)  acetaminophen (TYLENOL) tablet 650 mg (has no administration in time range)    Or  acetaminophen (TYLENOL) suppository 650 mg (has no administration in time range)  senna (SENOKOT) tablet 8.6 mg (has no administration in time range)  magnesium hydroxide (MILK OF MAGNESIA) suspension 30 mL (has no administration in time range)  sorbitol 70 % solution 30 mL (has no administration in time range)  sodium phosphate (FLEET) 7-19 GM/118ML enema 1 enema (has no administration in time range)  ondansetron (ZOFRAN) tablet 4 mg (has no administration in time range)    Or  ondansetron (ZOFRAN) injection 4 mg (has no administration in time range)  enoxaparin (LOVENOX) injection 55 mg (has no administration in time range)  albuterol (VENTOLIN HFA) 108 (90 Base) MCG/ACT inhaler 2 puff (2 puffs Inhalation Given 06/10/19 1711)  dexamethasone (DECADRON) injection 6 mg (6 mg Intravenous Given 06/10/19 1712)  guaiFENesin-dextromethorphan (ROBITUSSIN DM) 100-10 MG/5ML syrup 10 mL (has no administration in time range)  chlorpheniramine-HYDROcodone (TUSSIONEX) 10-8 MG/5ML suspension 5 mL (has no administration in time range)  ascorbic acid (VITAMIN C) tablet 500 mg (500 mg Oral Given 06/10/19 1714)    zinc sulfate capsule 220 mg (220 mg Oral Given 06/10/19 1714)  remdesivir 200 mg in sodium chloride 0.9% 250 mL IVPB (200 mg Intravenous New Bag/Given 06/10/19 1711)    Followed by  remdesivir 100 mg in sodium chloride 0.9 % 100 mL IVPB (has no administration in time range)  iohexol (OMNIPAQUE) 350 MG/ML injection 100 mL (100 mLs Intravenous Contrast Given 06/10/19 1523)    ED Course  I have reviewed the triage vital signs and the nursing notes.  Pertinent labs & imaging results that were available during my care of the patient were reviewed by me and considered in my medical decision making (see chart for details).  Clinical Course as of Jan 05  [place] Jun 10, 2019  1315 Ambulated patient in the room with O2 saturation at 90%. During ambulation patient became very lightheaded and almost fell over. I caught him and he was placed in the chair. His O2 saturation dropped down to 86%. Patient was placed on 2L [place].   [CA]  1613 Spoke to Dr. Pietro Cassis who agrees to evaluate and admit patient for further COVID treatment   [CA]    Clinical Course User Index [CA] Suzy Bouchard, PA-C   MDM Rules/Calculators/A&P                      79 year old male presents the ED with Covid-like symptoms of body aches, fatigue, dry cough, and chills x4 days. Upon arrival, patient afebrile, O2 saturation at 92% on room air and tachypneic at 28. Patient in no acute distress, but appears ill. Patient ambulated by myself in the room and maintained O2 saturation at 90%, but became very short of breath and almost fell over. Patient placed back in chair and O2 saturation dropped down to 86% on room air. Patient placed on 2L [place]. Lungs clear to auscultation bilaterally with mild decreased air movement at the bases. No lower extremity edema. Suspect patient's symptoms related to COVID infection. Will order [place] pre-admission labs given his drop in O2 saturation and need for likely admission.   CXR personally reviewed which  is negative for signs of infection. Given patient has a normal CXR and became extremely SOB and almost fell over during ambulation will obtain CTA to rule out PE. CBC reassuring with no leukocytosis and normal hemoglobin. Rapid COVID positive. Inflammatory markers elevated: LDH at 305, TGs at 177, Fibrinogen 486, D-dimer 1.02. Procalcitonin normal at 0.23. Doubt bacterial infection at this time. CMP significant for hyponatremia at 127, glucose at 311 with no anion gap. Normal lactic acid at 1.7. CTA personally reviewed which demonstrates: Bilateral ground-glass opacities consistent with the given clinical  history of COVID-19 positivity.   And no signs of PE. Given patient new hypoxia, will consult hospitalist for admission. Discussed case with Dr. Zenia Resides who agrees with assessment and plan. Spoke to Dr. Pietro Cassis with [facility] who agrees to admit patient for further COVID treatment.   Lyda Kalata Wernette Jr. was evaluated in Emergency Department on 06/10/2019 for the symptoms described in the history of present illness. He was evaluated in the context of the global COVID-19 pandemic, which necessitated consideration that the patient might be at risk for infection with the SARS-CoV-2 virus that causes COVID-19. Institutional protocols and algorithms that pertain to the evaluation of patients at risk for COVID-19 are in a state of rapid change based on information released by regulatory bodies including the CDC and federal and state organizations. These policies and algorithms were followed during the patient's care in the ED.   Final Clinical Impression(s) / ED Diagnoses Final diagnoses:  COVID-19 virus infection    Rx / DC Orders ED Discharge Orders    None       Karie Kirks 06/10/19 1734    Lacretia Leigh, MD 06/11/19 [phone]

## 2019-06-11 LAB — HEMOGLOBIN A1C
Hgb A1c MFr Bld: 12.3 % — ABNORMAL HIGH (ref 4.8–5.6)
Mean Plasma Glucose: 306.31 mg/dL

## 2019-06-11 LAB — BASIC METABOLIC PANEL
Anion gap: 11 (ref 5–15)
BUN: 21 mg/dL (ref 8–23)
CO2: 24 mmol/L (ref 22–32)
Calcium: 7.9 mg/dL — ABNORMAL LOW (ref 8.9–10.3)
Chloride: 95 mmol/L — ABNORMAL LOW (ref 98–111)
Creatinine, Ser: 1.15 mg/dL (ref 0.61–1.24)
GFR calc Af Amer: >60 mL/min (ref >60)
GFR calc non Af Amer: >60 mL/min (ref >60)
Glucose, Bld: 362 mg/dL — ABNORMAL HIGH (ref 70–99)
Potassium: 4.2 mmol/L (ref 3.5–5.1)
Sodium: 130 mmol/L — ABNORMAL LOW (ref 135–145)

## 2019-06-11 LAB — GLUCOSE, CAPILLARY
Glucose-Capillary: 289 mg/dL — ABNORMAL HIGH (ref 70–99)
Glucose-Capillary: 333 mg/dL — ABNORMAL HIGH (ref 70–99)

## 2019-06-11 LAB — CBG MONITORING, ED
Glucose-Capillary: 371 mg/dL — ABNORMAL HIGH (ref 70–99)
Glucose-Capillary: 372 mg/dL — ABNORMAL HIGH (ref 70–99)

## 2019-06-11 LAB — D-DIMER, QUANTITATIVE: D-Dimer, Quant: 0.79 ug/mL-FEU — ABNORMAL HIGH (ref 0.00–0.50)

## 2019-06-11 LAB — CBC
HCT: 39.7 % (ref 39.0–52.0)
Hemoglobin: 13.1 g/dL (ref 13.0–17.0)
MCH: 26.8 pg (ref 26.0–34.0)
MCHC: 33 g/dL (ref 30.0–36.0)
MCV: 81.4 fL (ref 80.0–100.0)
Platelets: 238 10*3/uL (ref 150–400)
RBC: 4.88 MIL/uL (ref 4.22–5.81)
RDW: 14.4 % (ref 11.5–15.5)
WBC: 2.6 10*3/uL — ABNORMAL LOW (ref 4.0–10.5)
nRBC: 0 % (ref 0.0–0.2)

## 2019-06-11 LAB — FERRITIN
Ferritin: 747 ng/mL — ABNORMAL HIGH (ref 24–336)
Ferritin: 833 ng/mL — ABNORMAL HIGH (ref 24–336)

## 2019-06-11 LAB — C-REACTIVE PROTEIN
CRP: 13.9 mg/dL — ABNORMAL HIGH (ref <1.0)
CRP: 16.8 mg/dL — ABNORMAL HIGH (ref <1.0)

## 2019-06-11 MED ORDER — INSULIN ASPART 100 UNIT/ML ~~LOC~~ SOLN
0.0000 [IU] | Freq: Every day | SUBCUTANEOUS | Status: DC
  Administered 2019-06-11: 4 [IU] via SUBCUTANEOUS
  Administered 2019-06-12: 22:00:00 3 [IU] via SUBCUTANEOUS
  Filled 2019-06-11: qty 0.05

## 2019-06-11 MED ORDER — INSULIN GLARGINE 100 UNIT/ML ~~LOC~~ SOLN
10.0000 [IU] | Freq: Every day | SUBCUTANEOUS | Status: DC
  Administered 2019-06-11: 17:00:00 10 [IU] via SUBCUTANEOUS
  Filled 2019-06-11 (×2): qty 0.1

## 2019-06-11 MED ORDER — METFORMIN HCL 500 MG PO TABS
500.0000 mg | ORAL_TABLET | Freq: Two times a day (BID) | ORAL | Status: DC
  Administered 2019-06-11 – 2019-06-14 (×6): 500 mg via ORAL
  Filled 2019-06-11 (×6): qty 1

## 2019-06-11 MED ORDER — INSULIN ASPART 100 UNIT/ML ~~LOC~~ SOLN
0.0000 [IU] | Freq: Three times a day (TID) | SUBCUTANEOUS | Status: DC
  Administered 2019-06-11: 17:00:00 5 [IU] via SUBCUTANEOUS
  Administered 2019-06-11 (×2): 9 [IU] via SUBCUTANEOUS
  Administered 2019-06-12: 7 [IU] via SUBCUTANEOUS
  Administered 2019-06-12: 17:00:00 5 [IU] via SUBCUTANEOUS
  Administered 2019-06-12: 7 [IU] via SUBCUTANEOUS
  Administered 2019-06-13: 13:00:00 3 [IU] via SUBCUTANEOUS
  Administered 2019-06-13 – 2019-06-14 (×2): 5 [IU] via SUBCUTANEOUS
  Administered 2019-06-14: 08:00:00 3 [IU] via SUBCUTANEOUS
  Filled 2019-06-11: qty 0.09

## 2019-06-11 NOTE — Progress Notes (Signed)
Inpatient Diabetes Program Recommendations  AACE/ADA: New Consensus Statement on Inpatient Glycemic Control (2015)  Target Ranges:  Prepandial:   less than 140 mg/dL      Peak postprandial:   less than 180 mg/dL (1-2 hours)      Critically ill patients:  140 - 180 mg/dL   Results for JAMESON, TORMEY (MRN [id number]) as of 06/11/2019 09:31  Ref. Range 06/11/2019 08:11  Glucose-Capillary Latest Ref Range: 70 - 99 mg/dL 330 (H)  9 units NOVOLOG    Results for NOLEN, LINDAMOOD (MRN [id number]) as of 06/11/2019 09:31  Ref. Range 06/11/2019 05:00  Hemoglobin A1C Latest Ref Range: 4.8 - 5.6 % 12.3 (H)  (306 mg/dl)    Admit with: COVID pneumonia/ Hyponatremia  History: None  Current Orders: Novolog Sensitive Correction Scale/ SSI (0-9 units) TID AC + HS     Getting Decadron 6 mg Daily.    MD- Note patient's A1c came back at 12.3%--No History of Diabetes noted in H&P.  Assume this is a new diagnosis??  Please address with patient.    Diabetes Coordinator will attempt to contact patient to discuss new diagnosis once diagnosis has been addressed by the Attending MD.    --Will follow patient during hospitalization--  Ambrose Finland RN, MSN, CDE Diabetes Coordinator Inpatient Glycemic Control Team Team Pager: [phone] (8a-5p)

## 2019-06-11 NOTE — ED Notes (Signed)
O2 sat validated at 82% but pt's waveform not noted to be adequate. Pt is noted to be sitting up in bed in no acute distress, eating breakfast.

## 2019-06-11 NOTE — Progress Notes (Addendum)
PROGRESS NOTE  Tony Bertone Arzuaga Jr.  DOB: [birth year]  PCP: Patient, No Pcp Per [facility]:[id number]  DOA: 06/10/2019 Admitted From: Home   LOS: 1 day   Chief Complaint  Patient presents with  . Generalized Body Aches  . Cough  . Shortness of Breath   Brief narrative: Tony Krasinski. is a 79 y.o. male with no significant past medical history who presented to the ED with cough, fatigue.  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  Started having cough few days after the exposure.  It is dry and 'deep-seated'.  He also started having generalized body aches and fatigue which has been progressing.   In the ED, patient was afebrile, hemodynamically stable.  He was initially maintained oxygen saturation more than 90% on room air at rest.  On minimal ambulation, patient started to drop oxygen saturation to 80s requiring supplemental oxygen.  At the time of my evaluation, patient is on 2 L oxygen to maintain saturation more than 90%. Work-up showed WBC count normal at 5.4, sodium level low at 127, BUN/creatinine normal. Lactic acid normal 1.7, procalcitonin slightly elevated to 0.23 COVID-19 antigen test positive. Serum inflammatory markers elevated: LDH 305, D-dimer 1.02, fibrinogen 486. Chest x-ray normal CT angio of chest did not show any evidence of pulmonary embolism but showed bilateral groundglass opacities.  Subjective: Patient was seen and examined this morning.  Pleasant elderly African-American male.  Still remains on oxygen supplementation.  No new symptoms.  He is already asking when he can go home.   Assessment/Plan: COVID pneumonia Acute respiratory failure with hypoxia - 2 lpm -Presented with cough, fatigue -Chest imaging -CT chest showed bibasilar opacities -COVID-19 positive -WBC and inflammatory markers as below. -Treatment: Started on Decadron 6 mg daily for 10 days, IV Remdesivir for  5 days to complete on 1/9, -Supportive care: Vitamin C, Zinc, inhalers, Tylenol, Antitussives - benzonatate, Mucinex -Oxygen - SpO2: 97 % O2 Flow Rate (L/min): 3 L/min -Continue airborne/contact isolation precautions. -Patient continues to remain on oxygen supplementation.  Inflammatory markers noted as below.  D-dimer level is improving but ferritin level and CRP level worsening. -If patient requires more than 4 L/min, we'll start the patient on Actemra.   Recent Labs  Lab 06/10/19 1320 06/11/19 0500  WBC 5.4 2.6*   Recent Labs    06/10/19 1320 06/11/19 0500  DDIMER 1.02* 0.79*  FERRITIN 747* 833*  LDH 305*  --   CRP 13.9* 16.8*    Hyponatremia -Sodium low at 127 at presentation.  Patient denies any past medical history.  Last labs from 2017 shows sodium level normal at 135. -Sodium level up to 130 today without IV hydration.  Continue to monitor.  New onset diabetes mellitus -Patient has blood glucose levels running in 300s.  Hemoglobin A1c is 12.3. -I explained the patient about the diagnosis and the treatment plan.  Despite a long conversation, patient continues to repeat the exertion that, ' I do not have diabetes and I am not can accept that diagnosis.'  Interestingly, he agrees to be started on Metformin and insulin. -I started the patient on Metformin 5 mg twice daily and Lantus 10 units at this time.  Continue sliding scale insulin with Accu-Cheks.  Definitely needs adjustment.  Mobility: Independent.  Increase ambulation Diet: Regular diet DVT prophylaxis:  Lovenox Code Status:  Full code Family Communication:  Patient states he will update the  family by himself Expected Discharge:  Not yet stable.  May need Actemra as well.  Ultimately will go home probably next 2 to 3 days.  Consultants:  None  Procedures:  None  Antimicrobials: Anti-infectives (From admission, onward)   Start     Dose/Rate Route Frequency Ordered Stop   06/11/19 1000  remdesivir 100 mg  in sodium chloride 0.9 % 100 mL IVPB     100 mg 200 mL/hr over 30 Minutes Intravenous Daily 06/10/19 1641 06/15/19 0959   06/10/19 1700  remdesivir 200 mg in sodium chloride 0.9% 250 mL IVPB     200 mg 580 mL/hr over 30 Minutes Intravenous Once 06/10/19 1641 06/10/19 1908        Code Status: Full Code   Diet Order            Diet regular Room service appropriate? Yes; Fluid consistency: Thin  Diet effective now              Infusions:  . remdesivir 100 mg in NS 100 mL Stopped (06/11/19 1316)    Scheduled Meds: . albuterol  2 puff Inhalation Q6H  . vitamin C  500 mg Oral Daily  . dexamethasone (DECADRON) injection  6 mg Intravenous Q24H  . enoxaparin (LOVENOX) injection  55 mg Subcutaneous Q24H  . insulin aspart  0-5 Units Subcutaneous QHS  . insulin aspart  0-9 Units Subcutaneous TID WC  . insulin glargine  10 Units Subcutaneous Daily  . metFORMIN  500 mg Oral BID WC  . senna  1 tablet Oral BID  . sodium chloride flush  3 mL Intravenous Q12H  . zinc sulfate  220 mg Oral Daily    PRN meds: acetaminophen **OR** acetaminophen, chlorpheniramine-HYDROcodone, guaiFENesin-dextromethorphan, magnesium hydroxide, ondansetron **OR** ondansetron (ZOFRAN) IV, sodium phosphate, sorbitol   Objective: Vitals:   06/11/19 1400 06/11/19 1439  BP: (!) 153/99 139/85  Pulse: 88 88  Resp: 19 20  Temp:  98.3 F (36.8 C)  SpO2: 97% 97%    Intake/Output Summary (Last 24 hours) at 06/11/2019 1556 Last data filed at 06/11/2019 0238 Gross per 24 hour  Intake 250 ml  Output 625 ml  Net -375 ml   Filed Weights   06/10/19 1245  Weight: 113.4 kg   Weight change:  Body mass index is 38.01 kg/m.   Physical Exam: General exam: Appears calm and comfortable.  Skin: No rashes, lesions or ulcers. HEENT: Atraumatic, normocephalic, supple neck, no obvious bleeding Lungs: Clear to auscultation bilaterally CVS: Regular rate and rhythm GI/Abd soft, nontender, nondistended, bowel sound  present CNS: Alert, awake oriented x3 Psychiatry: Mood appropriate Extremities: No pedal edema, no calf tenderness  Data Review: I have personally reviewed the laboratory data and studies available.  Recent Labs  Lab 06/10/19 1320 06/11/19 0500  WBC 5.4 2.6*  NEUTROABS 3.5  --   HGB 13.3 13.1  HCT 39.7 39.7  MCV 79.7* 81.4  PLT 242 238   Recent Labs  Lab 06/10/19 1320 06/11/19 0500  NA 127* 130*  K 4.1 4.2  CL 90* 95*  CO2 22 24  GLUCOSE 311* 362*  BUN 16 21  CREATININE 1.12 1.15  CALCIUM 8.2* 7.9*   TN Lorin Glass, MD  [facility] 06/11/2019

## 2019-06-11 NOTE — ED Notes (Signed)
Pt repositioned in the bed and assisted with the urinal. Pt has call bell within reach.

## 2019-06-12 LAB — CBC
HCT: 37.2 % — ABNORMAL LOW (ref 39.0–52.0)
Hemoglobin: 12.1 g/dL — ABNORMAL LOW (ref 13.0–17.0)
MCH: 26.3 pg (ref 26.0–34.0)
MCHC: 32.5 g/dL (ref 30.0–36.0)
MCV: 80.9 fL (ref 80.0–100.0)
Platelets: 290 10*3/uL (ref 150–400)
RBC: 4.6 MIL/uL (ref 4.22–5.81)
RDW: 14.5 % (ref 11.5–15.5)
WBC: 6.9 10*3/uL (ref 4.0–10.5)
nRBC: 0 % (ref 0.0–0.2)

## 2019-06-12 LAB — BASIC METABOLIC PANEL
Anion gap: 10 (ref 5–15)
BUN: 33 mg/dL — ABNORMAL HIGH (ref 8–23)
CO2: 20 mmol/L — ABNORMAL LOW (ref 22–32)
Calcium: 8.1 mg/dL — ABNORMAL LOW (ref 8.9–10.3)
Chloride: 97 mmol/L — ABNORMAL LOW (ref 98–111)
Creatinine, Ser: 1.11 mg/dL (ref 0.61–1.24)
GFR calc Af Amer: >60 mL/min (ref >60)
GFR calc non Af Amer: >60 mL/min (ref >60)
Glucose, Bld: 350 mg/dL — ABNORMAL HIGH (ref 70–99)
Potassium: 4.4 mmol/L (ref 3.5–5.1)
Sodium: 127 mmol/L — ABNORMAL LOW (ref 135–145)

## 2019-06-12 LAB — GLUCOSE, CAPILLARY
Glucose-Capillary: 301 mg/dL — ABNORMAL HIGH (ref 70–99)
Glucose-Capillary: 314 mg/dL — ABNORMAL HIGH (ref 70–99)
Glucose-Capillary: 280 mg/dL — ABNORMAL HIGH (ref 70–99)
Glucose-Capillary: 287 mg/dL — ABNORMAL HIGH (ref 70–99)

## 2019-06-12 LAB — D-DIMER, QUANTITATIVE: D-Dimer, Quant: 0.55 ug/mL-FEU — ABNORMAL HIGH (ref 0.00–0.50)

## 2019-06-12 LAB — C-REACTIVE PROTEIN: CRP: 10.7 mg/dL — ABNORMAL HIGH (ref <1.0)

## 2019-06-12 LAB — FERRITIN: Ferritin: 765 ng/mL — ABNORMAL HIGH (ref 24–336)

## 2019-06-12 MED ORDER — GLIPIZIDE 5 MG PO TABS
5.0000 mg | ORAL_TABLET | Freq: Every day | ORAL | Status: DC
  Administered 2019-06-13 – 2019-06-14 (×2): 5 mg via ORAL
  Filled 2019-06-12 (×2): qty 1

## 2019-06-12 MED ORDER — INSULIN GLARGINE 100 UNIT/ML ~~LOC~~ SOLN
15.0000 [IU] | Freq: Every day | SUBCUTANEOUS | Status: DC
  Administered 2019-06-12: 15 [IU] via SUBCUTANEOUS
  Filled 2019-06-12 (×2): qty 0.15

## 2019-06-12 MED ORDER — LIVING WELL WITH DIABETES BOOK
Freq: Once | Status: AC
  Filled 2019-06-12 (×2): qty 1

## 2019-06-12 MED ORDER — INSULIN STARTER KIT- PEN NEEDLES (ENGLISH)
1.0000 | Freq: Once | Status: AC
  Administered 2019-06-12: 11:00:00 1
  Filled 2019-06-12: qty 1

## 2019-06-12 NOTE — Progress Notes (Signed)
PROGRESS NOTE  Tony Coate Abplanalp Jr.  DOB: 1940-12-29  PCP: Patient, No Pcp Per [id number]  DOA: 06/10/2019 Admitted From: Home   LOS: 2 days   Chief Complaint  Patient presents with  . Generalized Body Aches  . Cough  . Shortness of Breath   Brief narrative: Tony Fogleman. is a 79 y.o. male with no significant past medical history who presented to the ED with cough, fatigue.  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  Started having cough few days after the exposure.  It is dry and 'deep-seated'.  He also started having generalized body aches and fatigue which has been progressing.   In the ED, patient was afebrile, hemodynamically stable.  He was initially maintained oxygen saturation more than 90% on room air at rest.  On minimal ambulation, patient started to drop oxygen saturation to 80s requiring supplemental oxygen.  At the time of my evaluation, patient is on 2 L oxygen to maintain saturation more than 90%. Work-up showed WBC count normal at 5.4, sodium level low at 127, BUN/creatinine normal. Lactic acid normal 1.7, procalcitonin slightly elevated to 0.23 COVID-19 antigen test positive. Serum inflammatory markers elevated: LDH 305, D-dimer 1.02, fibrinogen 486. Chest x-ray normal CT angio of chest did not show any evidence of pulmonary embolism but showed bilateral groundglass opacities.  Subjective: Patient was seen and examined this morning.   Pleasant.  Not in distress.  Continues to remain on oxygen supplementation.   On ambulation today, he was significantly tachycardic and short of breath, qualifies for home oxygen supplementation.    Assessment/Plan: COVID pneumonia Acute respiratory failure with hypoxia - 2 lpm -Presented with cough, fatigue -Chest imaging -CT chest showed bibasilar opacities -COVID-19 positive -WBC and inflammatory markers as below. -Treatment:  Started on Decadron 6 mg daily for 10 days, IV Remdesivir for 5 days to complete on 1/9, -Supportive care: Vitamin C, Zinc, inhalers, Tylenol, Antitussives - benzonatate, Mucinex -SpO2: 94 % O2 Flow Rate (L/min): 3 L/min -Continue airborne/contact isolation precautions. -Patient continues to remain on oxygen supplementation.  Inflammatory markers are improving as mentioned below.   Recent Labs  Lab 06/10/19 1320 06/11/19 0500 06/12/19 0455  WBC 5.4 2.6* 6.9   Recent Labs    06/10/19 1320 06/11/19 0500 06/12/19 0455  DDIMER 1.02* 0.79* 0.55*  FERRITIN 747* 833* 765*  LDH 305*  --   --   CRP 13.9* 16.8* 10.7*   Hyponatremia -Sodium low at 127 at presentation.  Patient denies any past medical history.  Last labs from 2017 shows sodium level normal at 135. -Sodium level still remains low.  It is partly because of poor oral intake and partly pseudohyponatremia due to hyperglycemia.  New onset diabetes mellitus -Patient has blood glucose levels running in 300s.  Hemoglobin A1c is 12.3. -I explained the patient about the diagnosis and the treatment plan.  Despite a long conversation, patient continues to repeat the assertion that, ' I do not have diabetes and I am not can accept that diagnosis.'  Interestingly, he agrees to be started on Metformin and insulin. -I started the patient on glipizide 5 mg daily, Metformin 5 mg twice daily and Lantus 15 units daily.  Continue to monitor.  Continue sliding scale insulin with Accu-Cheks.  Needs further adjustment.  Mobility: Independent.  Increase ambulation Diet: Regular diet DVT prophylaxis:  Lovenox Code Status:  Full code Family Communication:  Patient states he has been updating the family by himself Expected Discharge:  Not yet stable.  Anticipate discharge to home in next 1-2 days Consultants:  None  Procedures:  None  Antimicrobials: Anti-infectives (From admission, onward)   Start     Dose/Rate Route Frequency Ordered Stop     06/11/19 1000  remdesivir 100 mg in sodium chloride 0.9 % 100 mL IVPB     100 mg 200 mL/hr over 30 Minutes Intravenous Daily 06/10/19 1641 06/15/19 0959   06/10/19 1700  remdesivir 200 mg in sodium chloride 0.9% 250 mL IVPB     200 mg 580 mL/hr over 30 Minutes Intravenous Once 06/10/19 1641 06/10/19 1908        Code Status: Full Code   Diet Order            Diet regular Room service appropriate? Yes; Fluid consistency: Thin  Diet effective now              Infusions:  . remdesivir 100 mg in NS 100 mL 100 mg (06/12/19 0952)    Scheduled Meds: . albuterol  2 puff Inhalation Q6H  . vitamin C  500 mg Oral Daily  . dexamethasone (DECADRON) injection  6 mg Intravenous Q24H  . enoxaparin (LOVENOX) injection  55 mg Subcutaneous Q24H  . glipiZIDE  5 mg Oral QAC breakfast  . insulin aspart  0-5 Units Subcutaneous QHS  . insulin aspart  0-9 Units Subcutaneous TID WC  . insulin glargine  15 Units Subcutaneous Daily  . metFORMIN  500 mg Oral BID WC  . senna  1 tablet Oral BID  . sodium chloride flush  3 mL Intravenous Q12H  . zinc sulfate  220 mg Oral Daily    PRN meds: acetaminophen **OR** acetaminophen, chlorpheniramine-HYDROcodone, guaiFENesin-dextromethorphan, magnesium hydroxide, ondansetron **OR** ondansetron (ZOFRAN) IV, sodium phosphate, sorbitol   Objective: Vitals:   06/12/19 0532 06/12/19 1220  BP: 109/65 138/76  Pulse: 60 90  Resp: 18 17  Temp: 97.9 F (36.6 C) 98.5 F (36.9 C)  SpO2: 91% 94%    Intake/Output Summary (Last 24 hours) at 06/12/2019 1728 Last data filed at 06/12/2019 1400 Gross per 24 hour  Intake 1060 ml  Output --  Net 1060 ml   Filed Weights   06/10/19 1245  Weight: 113.4 kg   Weight change:  Body mass index is 38.01 kg/m.   Physical Exam: General exam: Appears calm and comfortable.  Skin: No rashes, lesions or ulcers. HEENT: Atraumatic, normocephalic, supple neck, no obvious bleeding Lungs: Clear to auscultation  bilaterally CVS: Regular rate and rhythm GI/Abd soft, nontender, nondistended, bowel sound present CNS: Alert, awake oriented x3 Psychiatry: Mood appropriate Extremities: No pedal edema, no calf tenderness  Data Review: I have personally reviewed the laboratory data and studies available.  Recent Labs  Lab 06/10/19 1320 06/11/19 0500 06/12/19 0455  WBC 5.4 2.6* 6.9  NEUTROABS 3.5  --   --   HGB 13.3 13.1 12.1*  HCT 39.7 39.7 37.2*  MCV 79.7* 81.4 80.9  PLT 242 238 290   Recent Labs  Lab 06/10/19 1320 06/11/19 0500 06/12/19 0455  NA 127* 130* 127*  K 4.1 4.2 4.4  CL 90* 95* 97*  CO2 22 24 20*  GLUCOSE 311* 362* 350*  BUN 16 21 33*  CREATININE 1.12 1.15 1.11  CALCIUM 8.2* 7.9* 8.1*   TN Lorin Glass, MD  [facility] 06/12/2019

## 2019-06-12 NOTE — Progress Notes (Signed)
Inpatient Diabetes Program Recommendations  AACE/ADA: New Consensus Statement on Inpatient Glycemic Control (2015)  Target Ranges:  Prepandial:   less than 140 mg/dL      Peak postprandial:   less than 180 mg/dL (1-2 hours)      Critically ill patients:  140 - 180 mg/dL   Lab Results  Component Value Date   GLUCAP 301 (H) 06/12/2019   HGBA1C 12.3 (H) 06/11/2019    Review of Glycemic Control  Diabetes history: New Diagnosis DM 2  Current orders for Inpatient glycemic control:  Lantus 15 units Daily Novolog 0-9 units tid + hs Glipizide 5 mg Daily Metformin 500 mg bid  Decadron 6 mg Q24 hours  Does not have PCP. Informed pt to call back insurance card for list of physicians that accept his insurance   Inpatient Diabetes Program Recommendations:    - Consider adding Tradjenta in [place] DM pt only while inpatient (shown to lower mortality)  -  Noted Lantus increased to 15 units 1/7. Increase Lantus to 20 units.  -  Increase Novolog Correction to Resistant 0-20 units tid while on steroids.  Spoke with pt over the phone regarding new DM diagnosis. Pt reports his brother and sister both have DM. Discussed genetics and general patho of DM.  Spoke with patient about new diabetes diagnosis. Discussed A1C results (12.3%) and explained what an A1C is. Discussed basic home care, importance of checking CBGs and maintaining good CBG control to prevent long-term and short-term complications.  Reviewed glucose and A1C goals.  Reviewed signs and symptoms of hyperglycemia and hypoglycemia along with treatment for both. Discussed impact of nutrition, exercise, stress, sickness, and medications on diabetes control. Discussed diet modifications and beverage options. Reviewed Living Well with diabetes booklet and encouraged patient to read through entire book. Informed patient that he will be prescribed Metformin in addition to insulin.  Asked patient to check his glucose at least 2 times a day  (before your first meal of the day and your second check either before lunch, supper, or bedtime) and to keep a log book of glucose readings and insulin taken.   Emailed video ([email or web address]) on how to operate insulin pen, check CBGs, and hypoglycemia. Ordered the living well with DM educational booklet in addition to insulin pen starter kit. Also attached diet information to AVS.  Patient verbalized understanding of information discussed and he states that he has no further questions at this time related to diabetes.     RNs to provide ongoing basic DM education at bedside with this patient and engage patient to actively check blood glucose and administer insulin injections.   Pt should be able to be d/c'd on Lantus solostar insulin pen for $0 copay with the copay assistance link I emailed to pt.  At d/c:  Lantus solostar insulin pen (Order # [id number]) Insulin pen needles (order # [id number]) Glucose meter kit (orer # [id number])  Thanks, Tama Headings RN, MSN, BC-ADM Inpatient Diabetes Coordinator Team Pager [phone] (8a-5p)

## 2019-06-12 NOTE — Progress Notes (Addendum)
Oxygen saturation is 86 on room air while ambulating pt pulse raised up to 111 and he is very SOB. Pt back on 2L of  O2 sat is 93.

## 2019-06-13 LAB — CBC
HCT: 37.5 % — ABNORMAL LOW (ref 39.0–52.0)
Hemoglobin: 12 g/dL — ABNORMAL LOW (ref 13.0–17.0)
MCH: 26.1 pg (ref 26.0–34.0)
MCHC: 32 g/dL (ref 30.0–36.0)
MCV: 81.5 fL (ref 80.0–100.0)
Platelets: 313 10*3/uL (ref 150–400)
RBC: 4.6 MIL/uL (ref 4.22–5.81)
RDW: 14.4 % (ref 11.5–15.5)
WBC: 8.1 10*3/uL (ref 4.0–10.5)
nRBC: 0 % (ref 0.0–0.2)

## 2019-06-13 LAB — LIPID PANEL
Cholesterol: 155 mg/dL (ref 0–200)
HDL: 21 mg/dL — ABNORMAL LOW (ref >40)
LDL Cholesterol: 100 mg/dL — ABNORMAL HIGH (ref 0–99)
Total CHOL/HDL Ratio: 7.4 RATIO
Triglycerides: 172 mg/dL — ABNORMAL HIGH (ref <150)
VLDL: 34 mg/dL (ref 0–40)

## 2019-06-13 LAB — GLUCOSE, CAPILLARY
Glucose-Capillary: 283 mg/dL — ABNORMAL HIGH (ref 70–99)
Glucose-Capillary: 211 mg/dL — ABNORMAL HIGH (ref 70–99)
Glucose-Capillary: 113 mg/dL — ABNORMAL HIGH (ref 70–99)
Glucose-Capillary: 129 mg/dL — ABNORMAL HIGH (ref 70–99)

## 2019-06-13 LAB — BASIC METABOLIC PANEL
Anion gap: 10 (ref 5–15)
BUN: 30 mg/dL — ABNORMAL HIGH (ref 8–23)
CO2: 23 mmol/L (ref 22–32)
Calcium: 8.4 mg/dL — ABNORMAL LOW (ref 8.9–10.3)
Chloride: 98 mmol/L (ref 98–111)
Creatinine, Ser: 1.21 mg/dL (ref 0.61–1.24)
GFR calc Af Amer: >60 mL/min (ref >60)
GFR calc non Af Amer: 58 mL/min — ABNORMAL LOW (ref >60)
Glucose, Bld: 301 mg/dL — ABNORMAL HIGH (ref 70–99)
Potassium: 4.6 mmol/L (ref 3.5–5.1)
Sodium: 131 mmol/L — ABNORMAL LOW (ref 135–145)

## 2019-06-13 LAB — FERRITIN: Ferritin: 612 ng/mL — ABNORMAL HIGH (ref 24–336)

## 2019-06-13 LAB — C-REACTIVE PROTEIN: CRP: 6 mg/dL — ABNORMAL HIGH (ref <1.0)

## 2019-06-13 LAB — D-DIMER, QUANTITATIVE: D-Dimer, Quant: 0.5 ug/mL-FEU (ref 0.00–0.50)

## 2019-06-13 MED ORDER — INSULIN GLARGINE 100 UNIT/ML ~~LOC~~ SOLN
20.0000 [IU] | Freq: Every day | SUBCUTANEOUS | Status: DC
  Administered 2019-06-13 – 2019-06-14 (×2): 20 [IU] via SUBCUTANEOUS
  Filled 2019-06-13 (×2): qty 0.2

## 2019-06-13 NOTE — Progress Notes (Signed)
PROGRESS NOTE  Tony Cuffe Whitis Jr.  DOB: April 01, 1941  PCP: Patient, No Pcp Per [id number]  DOA: 06/10/2019 Admitted From: Home   LOS: 3 days   Chief Complaint  Patient presents with  . Generalized Body Aches  . Cough  . Shortness of Breath   Brief narrative: Tony Goldwire. is a 79 y.o. male with no significant past medical history who presented to the ED with cough, fatigue.  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  Started having cough few days after the exposure.  It is dry and 'deep-seated'.  He also started having generalized body aches and fatigue which has been progressing.   In the ED, patient was afebrile, hemodynamically stable.  He was initially maintained oxygen saturation more than 90% on room air at rest.  On minimal ambulation, patient started to drop oxygen saturation to 80s requiring supplemental oxygen.  At the time of my evaluation, patient is on 2 L oxygen to maintain saturation more than 90%. Work-up showed WBC count normal at 5.4, sodium level low at 127, BUN/creatinine normal. Lactic acid normal 1.7, procalcitonin slightly elevated to 0.23 COVID-19 antigen test positive. Serum inflammatory markers elevated: LDH 305, D-dimer 1.02, fibrinogen 486. Chest x-ray normal CT angio of chest did not show any evidence of pulmonary embolism but showed bilateral groundglass opacities. Patient admitted to hospitalist service for Covid pneumonia.  Subjective: Patient was seen and examined this morning.   Pleasant.  Not in distress.   Currently on room air at rest. Still requiring oxygen on ambulation.  Assessment/Plan: COVID pneumonia Acute respiratory failure with hypoxia - 2 lpm -Presented with cough, fatigue -Chest imaging -CT chest showed bibasilar opacities -COVID-19 positive -WBC and inflammatory markers as below. -Treatment: Started on Decadron 6 mg daily for 10  days, IV Remdesivir for 5 days to complete on 1/9, -Supportive care: Vitamin C, Zinc, inhalers, Tylenol, Antitussives - benzonatate, Mucinex -SpO2: 93 % O2 Flow Rate (L/min): 2 L/min -Continue airborne/contact isolation precautions. -Patient continues to remain on oxygen supplementation.  Inflammatory markers are improving as mentioned below.   Recent Labs  Lab 06/10/19 1320 06/11/19 0500 06/12/19 0455 06/13/19 0204  WBC 5.4 2.6* 6.9 8.1   Recent Labs    06/11/19 0500 06/12/19 0455 06/13/19 0204  DDIMER 0.79* 0.55* 0.50  FERRITIN 833* 765* 612*  CRP 16.8* 10.7* 6.0*   Hyponatremia -Sodium low at 127 at presentation, likely due to poor oral intake. -Sodium level improving, 131 today.  New onset diabetes mellitus II -A1c 12.3. -Patient has blood glucose levels running in 300s, difficult to control because of Decadron. -I started the patient on glipizide 5 mg daily, Metformin 500 mg twice daily and Lantus. Lantus dose being adjusted. 20 units ordered for tonight. -Diabetes coordinator consult placed. Patient taught to self inject insulin. Needs prescription for insulin as well as glucometer kit at discharge. -Lipid panel ordered. Blood pressure in normal range.  Mobility: Independent. Increase ambulation Diet: Diabetic diet DVT prophylaxis:  Lovenox Code Status:  Full code Family Communication:  I called and updated patient's sister Ms. Thelma from patient's room itself this morning. Expected Discharge:  Patient's lives alone. He is asking to get complete course of remdesivir in the hospital. Last dose tomorrow. Anticipate discharge to home with home health tomorrow.  Consultants:  None  Procedures:  None  Antimicrobials: Anti-infectives (From admission, onward)   Start  Dose/Rate Route Frequency Ordered Stop   06/11/19 1000  remdesivir 100 mg in sodium chloride 0.9 % 100 mL IVPB     100 mg 200 mL/hr over 30 Minutes Intravenous Daily 06/10/19 1641 06/15/19 0959    06/10/19 1700  remdesivir 200 mg in sodium chloride 0.9% 250 mL IVPB     200 mg 580 mL/hr over 30 Minutes Intravenous Once 06/10/19 1641 06/10/19 1908        Code Status: Full Code   Diet Order            Diet Carb Modified Fluid consistency: Thin; Room service appropriate? Yes  Diet effective now              Infusions:  . remdesivir 100 mg in NS 100 mL 100 mg (06/13/19 1101)    Scheduled Meds: . albuterol  2 puff Inhalation Q6H  . vitamin C  500 mg Oral Daily  . dexamethasone (DECADRON) injection  6 mg Intravenous Q24H  . enoxaparin (LOVENOX) injection  55 mg Subcutaneous Q24H  . glipiZIDE  5 mg Oral QAC breakfast  . insulin aspart  0-5 Units Subcutaneous QHS  . insulin aspart  0-9 Units Subcutaneous TID WC  . insulin glargine  20 Units Subcutaneous Daily  . metFORMIN  500 mg Oral BID WC  . senna  1 tablet Oral BID  . sodium chloride flush  3 mL Intravenous Q12H  . zinc sulfate  220 mg Oral Daily    PRN meds: acetaminophen **OR** acetaminophen, chlorpheniramine-HYDROcodone, guaiFENesin-dextromethorphan, magnesium hydroxide, ondansetron **OR** ondansetron (ZOFRAN) IV, sodium phosphate, sorbitol   Objective: Vitals:   06/13/19 1117 06/13/19 1406  BP:  131/81  Pulse:  90  Resp:  (!) 30  Temp:  98 F (36.7 C)  SpO2: 92% 93%    Intake/Output Summary (Last 24 hours) at 06/13/2019 1413 Last data filed at 06/13/2019 1131 Gross per 24 hour  Intake 520 ml  Output --  Net 520 ml   Filed Weights   06/10/19 1245  Weight: 113.4 kg   Weight change:  Body mass index is 38.01 kg/m.   Physical Exam: General exam: Appears calm and comfortable.  Skin: No rashes, lesions or ulcers. HEENT: Atraumatic, normocephalic, supple neck, no obvious bleeding Lungs: Clear to auscultation bilaterally CVS: Regular rate and rhythm GI/Abd soft, nontender, nondistended, bowel sound present CNS: Alert, awake oriented x3 Psychiatry: Mood appropriate Extremities: No pedal edema, no  calf tenderness  Data Review: I have personally reviewed the laboratory data and studies available.  Recent Labs  Lab 06/10/19 1320 06/11/19 0500 06/12/19 0455 06/13/19 0204  WBC 5.4 2.6* 6.9 8.1  NEUTROABS 3.5  --   --   --   HGB 13.3 13.1 12.1* 12.0*  HCT 39.7 39.7 37.2* 37.5*  MCV 79.7* 81.4 80.9 81.5  PLT 242 238 290 313   Recent Labs  Lab 06/10/19 1320 06/11/19 0500 06/12/19 0455 06/13/19 0204  NA 127* 130* 127* 131*  K 4.1 4.2 4.4 4.6  CL 90* 95* 97* 98  CO2 22 24 20* 23  GLUCOSE 311* 362* 350* 301*  BUN 16 21 33* 30*  CREATININE 1.12 1.15 1.11 1.21  CALCIUM 8.2* 7.9* 8.1* 8.4*   TN Terrilee Croak, MD  [facility] 06/13/2019

## 2019-06-13 NOTE — Progress Notes (Signed)
Inpatient Diabetes Program Recommendations  AACE/ADA: New Consensus Statement on Inpatient Glycemic Control (2015)  Target Ranges:  Prepandial:   less than 140 mg/dL      Peak postprandial:   less than 180 mg/dL (1-2 hours)      Critically ill patients:  140 - 180 mg/dL   Lab Results  Component Value Date   GLUCAP 283 (H) 06/13/2019   HGBA1C 12.3 (H) 06/11/2019    Review of Glycemic Control  Diabetes history: New Diagnosis DM 2  Current orders for Inpatient glycemic control:  Lantus 15 units Daily Novolog 0-9 units tid + hs Glipizide 5 mg Daily Metformin 500 mg bid  Decadron 6 mg Q24 hours  Does not have PCP. Informed pt to call back insurance card for list of physicians that accept his insurance   Inpatient Diabetes Program Recommendations:    -  Noted Lantus increased to 15 units 1/7. Increase Lantus to 20 units.  -  Increase Novolog Correction to Resistant 0-20 units tid while on steroids.  Spoke with pt over the phone yesterday 1/7.   Pt should be able to be d/c'd on Lantus solostar insulin pen for $0 copay with the copay assistance link I emailed to pt.  At d/c:  Lantus solostar insulin pen (Order # [id number]) Insulin pen needles (order # [id number]) Glucose meter kit (orer # [id number])  Thanks, Tama Headings RN, MSN, BC-ADM Inpatient Diabetes Coordinator Team Pager [phone] (8a-5p)

## 2019-06-13 NOTE — Progress Notes (Signed)
Pt coughing a lot this afternoon.  Tried both cough medications. Took a shower and got very short of breath, oxygen sats was 88% room air after his activities. O2 was placed and he came up to 92 on 2 liters.

## 2019-06-13 NOTE — Care Management Important Message (Signed)
Important Message  Patient Details IM Letter given to Windell Moulding SW Case Manager to present to the Patient Name: Tony Russell. MRN: [id number] Date of Birth: 26-Mar-1941   Medicare Important Message Given:  Yes     Caren Macadam 06/13/2019, 11:19 AM

## 2019-06-14 DIAGNOSIS — E1165 Type 2 diabetes mellitus with hyperglycemia: Secondary | ICD-10-CM | POA: Diagnosis present

## 2019-06-14 DIAGNOSIS — J9602 Acute respiratory failure with hypercapnia: Secondary | ICD-10-CM

## 2019-06-14 DIAGNOSIS — J9601 Acute respiratory failure with hypoxia: Secondary | ICD-10-CM | POA: Diagnosis present

## 2019-06-14 LAB — D-DIMER, QUANTITATIVE: D-Dimer, Quant: 0.78 ug/mL-FEU — ABNORMAL HIGH (ref 0.00–0.50)

## 2019-06-14 LAB — FERRITIN: Ferritin: 602 ng/mL — ABNORMAL HIGH (ref 24–336)

## 2019-06-14 LAB — GLUCOSE, CAPILLARY
Glucose-Capillary: 232 mg/dL — ABNORMAL HIGH (ref 70–99)
Glucose-Capillary: 257 mg/dL — ABNORMAL HIGH (ref 70–99)
Glucose-Capillary: 223 mg/dL — ABNORMAL HIGH (ref 70–99)

## 2019-06-14 LAB — C-REACTIVE PROTEIN: CRP: 7 mg/dL — ABNORMAL HIGH (ref <1.0)

## 2019-06-14 MED ORDER — GLIPIZIDE 5 MG PO TABS: 5.0000 mg | ORAL_TABLET | Freq: Every day | ORAL | 0 refills | Status: DC

## 2019-06-14 MED ORDER — GUAIFENESIN-DM 100-10 MG/5ML PO SYRP: 10.0000 mL | ORAL_SOLUTION | ORAL | 0 refills | Status: DC | PRN

## 2019-06-14 MED ORDER — DEXAMETHASONE 6 MG PO TABS: 6.0000 mg | ORAL_TABLET | Freq: Every day | ORAL | 0 refills | Status: DC

## 2019-06-14 MED ORDER — INSULIN GLARGINE 100 UNIT/ML ~~LOC~~ SOLN: 20.0000 [IU] | Freq: Every day | SUBCUTANEOUS | 11 refills | Status: DC

## 2019-06-14 MED ORDER — METFORMIN HCL 500 MG PO TABS: 500.0000 mg | ORAL_TABLET | Freq: Two times a day (BID) | ORAL | 0 refills | Status: DC

## 2019-06-14 MED ORDER — ZINC SULFATE 220 (50 ZN) MG PO CAPS: 220.0000 mg | ORAL_CAPSULE | Freq: Every day | ORAL | 0 refills | Status: DC

## 2019-06-14 MED ORDER — ASCORBIC ACID 500 MG PO TABS: 500.0000 mg | ORAL_TABLET | Freq: Every day | ORAL | 0 refills | Status: DC

## 2019-06-14 MED ORDER — ASPIRIN EC 81 MG PO TBEC: 81.0000 mg | DELAYED_RELEASE_TABLET | Freq: Every day | ORAL | 2 refills | Status: AC

## 2019-06-14 MED ORDER — SENNA 8.6 MG PO TABS: 1.0000 | ORAL_TABLET | Freq: Two times a day (BID) | ORAL | 0 refills | Status: DC

## 2019-06-14 MED ORDER — ALBUTEROL SULFATE HFA 108 (90 BASE) MCG/ACT IN AERS: 2.0000 | INHALATION_SPRAY | Freq: Four times a day (QID) | RESPIRATORY_TRACT | 0 refills | Status: DC

## 2019-06-14 NOTE — Discharge Summary (Signed)
Discharge Summary  Tony Dia Husted Jr. UVO:[id number] DOB: 03-May-1941  PCP: Patient, No Pcp Per  Admit date: 06/10/2019 Discharge date: 06/14/2019  Time spent: Less than 31 minutes  Recommendations for Outpatient Follow-up:  1. Home follow-up with primary care provider  Discharge Diagnoses:  Active Hospital Problems   Diagnosis Date Noted  . Hyperglycemia due to diabetes mellitus ([place]) 06/14/2019  . Acute respiratory failure with hypoxia and hypercapnia ([place]) 06/14/2019  . Pneumonia due to COVID-19 virus 06/10/2019    Resolved Hospital Problems  No resolved problems to display.    Discharge Condition: Improved  Diet recommendation: 1800-calorie diet diabetic  Vitals:   06/14/19 1258 06/14/19 1402  BP:  (!) 146/81  Pulse:  99  Resp:  20  Temp:  98.5 F (36.9 C)  SpO2: 93% 91%    History of present illness:  Tony Russellis a 79 y.o.malewith no significant past medical history who presented to the ED with cough, fatigue.  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 Started having cough few days after the exposure. It is dry and 'deep-seated'. He also started having generalized body aches and fatigue which has been progressing.  In the ED, patient was afebrile, hemodynamically stable. He was initially maintained oxygen saturation more than 90% on room air at rest. On minimal ambulation, patient started to drop oxygen saturation to 80s requiring supplemental oxygen. At the time of my evaluation, patient is on 2 L oxygen to maintain saturation more than 90%. Work-up showed WBC count normal at 5.4, sodium level low at 127, BUN/creatinine normal. Lactic acid normal 1.7, procalcitonin slightly elevated to 0.23 COVID-19 antigen test positive. Serum inflammatory markers elevated:LDH 305, D-dimer 1.02, fibrinogen 486. Chest x-ray normal CT angio of chest did not show any evidence  of pulmonary embolism but showed bilateral groundglass opacities. Patient admitted to hospitalist service for Covid pneumonia.   Hospital Course:  Active Problems:   Pneumonia due to COVID-19 virus   Hyperglycemia due to diabetes mellitus ([place])   Acute respiratory failure with hypoxia and hypercapnia ([place]) Patient was admitted for COVID-19 pneumonia with respiratory failure and hypoxia.  He required 2 L/min but today he has been doing well without oxygen saturation has been stable he was treated with remdesivir and Decadron IV during his hospitalization he is sodium was low was 127 with IV hydration gently it has improved also has his sugar was running high and had to receive sever doses of sliding scale high-dose insulin as well as Lantus was started.  He stable to be discharged home to continue quarantine until he is 14 days and he will gradually return to full activity diet is 1800-calorie diet activities as tolerated  Procedures:  None  Consultations:  None  Discharge Exam: BP (!) 146/81 (BP Location: Right Arm)   Pulse 99   Temp 98.5 F (36.9 C) (Oral)   Resp 20   Ht 5\' 8"  (1.727 m)   Wt 113.4 kg   SpO2 91%   BMI 38.01 kg/m   General: Overweight not in distress pleasant Cardiovascular: Regular rate and rhythm no edema Respiratory: Clear to auscultation  Discharge Instructions You were cared for by a hospitalist during your hospital stay. If you have any questions about your discharge medications or the care you received while you were in the hospital after you are discharged, you can call the unit and asked to speak with the  hospitalist on call if the hospitalist that took care of you is not available. Once you are discharged, your primary care physician will handle any further medical issues. Please note that NO REFILLS for any discharge medications will be authorized once you are discharged, as it is imperative that you return to your primary care physician (or establish a  relationship with a primary care physician if you do not have one) for your aftercare needs so that they can reassess your need for medications and monitor your lab values.  Discharge Instructions    Call MD for:  difficulty breathing, headache or visual disturbances   Complete by: As directed    Call MD for:  temperature >100.4   Complete by: As directed    Diet - low sodium heart healthy   Complete by: As directed    Discharge instructions   Complete by: As directed    Keep well-hydrated complete Decadron as prescribed follow-up with primary care provider in 1 to 2 weeks   Increase activity slowly   Complete by: As directed    Temperature monitoring   Complete by: Jun 14, 2019    After how many days would you like to receive a notification of this patient's flowsheet entries?: 1   MyChart COVID-19 home monitoring program   Complete by: Jun 15, 2019    Is the patient willing to use the MyChart Mobile App for home monitoring?: Yes     Allergies as of 06/14/2019   No Known Allergies     Medication List    STOP taking these medications   clindamycin 150 MG capsule Commonly known as: CLEOCIN     TAKE these medications   albuterol 108 (90 Base) MCG/ACT inhaler Commonly known as: VENTOLIN HFA Inhale 2 puffs into the lungs every 6 (six) hours.   ascorbic acid 500 MG tablet Commonly known as: VITAMIN C Take 1 tablet (500 mg total) by mouth daily. Start taking on: June 15, 2019   aspirin EC 81 MG tablet Take 1 tablet (81 mg total) by mouth daily.   dexamethasone 6 MG tablet Commonly known as: DECADRON Take 1 tablet (6 mg total) by mouth daily.   glipiZIDE 5 MG tablet Commonly known as: GLUCOTROL Take 1 tablet (5 mg total) by mouth daily before breakfast. Start taking on: June 15, 2019   guaiFENesin-dextromethorphan 100-10 MG/5ML syrup Commonly known as: ROBITUSSIN DM Take 10 mLs by mouth every 4 (four) hours as needed for cough.   insulin glargine 100 UNIT/ML  injection Commonly known as: LANTUS Inject 0.2 mLs (20 Units total) into the skin daily. Start taking on: June 15, 2019   metFORMIN 500 MG tablet Commonly known as: GLUCOPHAGE Take 1 tablet (500 mg total) by mouth 2 (two) times daily with a meal.   senna 8.6 MG Tabs tablet Commonly known as: SENOKOT Take 1 tablet (8.6 mg total) by mouth 2 (two) times daily.   trimethoprim-polymyxin b ophthalmic solution Commonly known as: Polytrim Place 1 drop into the right eye every 4 (four) hours.   zinc sulfate 220 (50 Zn) MG capsule Take 1 capsule (220 mg total) by mouth daily. Start taking on: June 15, 2019      No Known Allergies    The results of significant diagnostics from this hospitalization (including imaging, microbiology, ancillary and laboratory) are listed below for reference.    Significant Diagnostic Studies: CT Angio Chest PE W and/or Wo Contrast  Result Date: 06/10/2019 CLINICAL DATA:  Shortness of breath  and cough, history of COVID-19 positivity EXAM: CT ANGIOGRAPHY CHEST WITH CONTRAST TECHNIQUE: Multidetector CT imaging of the chest was performed using the standard protocol during bolus administration of intravenous contrast. Multiplanar CT image reconstructions and MIPs were obtained to evaluate the vascular anatomy. CONTRAST:  OMNIPAQUE IOHEXOL 350 MG/ML SOLN COMPARISON:  Chest x-ray from earlier in the same day. FINDINGS: Cardiovascular: Thoracic aorta demonstrates atherosclerotic calcifications without aneurysmal dilatation or dissection. No cardiac enlargement is seen. The pulmonary artery shows a normal branching pattern. No definitive filling defect to suggest pulmonary embolism is seen although peripheral opacification in the lower lobes is somewhat limited. Mediastinum/Nodes: Thoracic inlet is within normal limits. No sizable hilar or mediastinal adenopathy is noted. The esophagus as visualized shows a small sliding-type hiatal hernia. No other focal  abnormality is seen. Lungs/Pleura: Calcified granulomas are seen. Additionally some peripheral ground-glass opacities are noted within both lungs consistent with the given clinical history. No sizable effusion or pneumothorax is noted. No sizable parenchymal nodules are seen. Upper Abdomen: Visualized upper abdomen is within normal limits. Musculoskeletal: Mild degenerative changes of the thoracic spine are noted. Review of the MIP images confirms the above findings. IMPRESSION: Bilateral ground-glass opacities consistent with the given clinical history of COVID-19 positivity. No evidence of pulmonary emboli. Electronically Signed   By: Alcide Clever M.D.   On: 06/10/2019 15:40   DG Chest Portable 1 View  Result Date: 06/10/2019 CLINICAL DATA:  Shortness of breath. Additional history provided: Generalized body aches and fatigue on Friday, now coughing with shortness of breath. EXAM: PORTABLE CHEST 1 VIEW COMPARISON:  No pertinent prior studies available for comparison. FINDINGS: Heart size within normal limits. No airspace consolidation is identified. No evidence of pleural effusion or pneumothorax. No acute bony abnormality. Overlying cardiac monitoring leads. IMPRESSION: No evidence of acute cardiopulmonary abnormality. Electronically Signed   By: Jackey Loge DO   On: 06/10/2019 13:49    Microbiology: Recent Results (from the past 240 hour(s))  Blood Culture (routine x 2)     Status: None (Preliminary result)   Collection Time: 06/10/19  1:25 PM   Specimen: BLOOD  Result Value Ref Range Status   Specimen Description   Final    BLOOD LEFT ANTECUBITAL Performed at [facility], [address] [place], [place] [address]    Special Requests   Final    BOTTLES DRAWN AEROBIC AND ANAEROBIC Blood Culture adequate volume Performed at [facility], [address], [place], [place] [address]    Culture   Final    NO GROWTH 4 DAYS Performed at [facility], [address], [place], [place] [address]    Report Status PENDING  Incomplete  Blood Culture (routine x 2)     Status: None (Preliminary result)   Collection Time: 06/10/19  2:00 PM   Specimen: BLOOD RIGHT HAND  Result Value Ref Range Status   Specimen Description   Final    BLOOD RIGHT HAND Performed at [facility], [address] [place], [place] [address]    Special Requests   Final    BOTTLES DRAWN AEROBIC AND ANAEROBIC Blood Culture adequate volume Performed at [facility], [address], [place], [place] [address]    Culture   Final    NO GROWTH 4 DAYS Performed at [facility], [address], [place], [place] [address]    Report Status PENDING  Incomplete     Labs: Basic Metabolic  Panel: Recent Labs  Lab 06/10/19 1320 06/11/19 0500 06/12/19 0455 06/13/19 0204  NA 127* 130* 127* 131*  K 4.1 4.2 4.4 4.6  CL 90* 95* 97* 98  CO2 22 24 20* 23  GLUCOSE 311* 362* 350* 301*  BUN 16 21 33* 30*  CREATININE 1.12 1.15 1.11 1.21  CALCIUM 8.2* 7.9* 8.1* 8.4*   Liver Function Tests: Recent Labs  Lab 06/10/19 1320  AST 26  ALT 20  ALKPHOS 68  BILITOT 0.7  PROT 7.5  ALBUMIN 3.3*   No results for input(s): LIPASE, AMYLASE in the last 168 hours. No results for input(s): AMMONIA in the last 168 hours. CBC: Recent Labs  Lab 06/10/19 1320 06/11/19 0500 06/12/19 0455 06/13/19 0204  WBC 5.4 2.6* 6.9 8.1  NEUTROABS 3.5  --   --   --   HGB 13.3 13.1 12.1* 12.0*  HCT 39.7 39.7 37.2* 37.5*  MCV 79.7* 81.4 80.9 81.5  PLT 242 238 290 313   Cardiac Enzymes: No results for input(s): CKTOTAL, CKMB, CKMBINDEX, TROPONINI in the last 168 hours. BNP: BNP (last 3 results) No results for input(s): BNP in the last 8760 hours.  ProBNP (last 3 results) No results for input(s): PROBNP in the last 8760 hours.  CBG: Recent Labs  Lab 06/13/19 1207 06/13/19 1739 06/13/19 2130 06/14/19 0720 06/14/19 1118  GLUCAP 211* 113* 129* 232* 257*        Signed:  Myrtie Neither, MD [facility] 06/14/2019, 4:24 PM

## 2019-06-15 LAB — CULTURE, BLOOD (ROUTINE X 2)
Culture: NO GROWTH
Culture: NO GROWTH
Special Requests: ADEQUATE
Special Requests: ADEQUATE

## 2019-08-06 ENCOUNTER — Ambulatory Visit: Payer: Medicare HMO | Attending: Internal Medicine

## 2019-08-06 DIAGNOSIS — Z23 Encounter for immunization: Secondary | ICD-10-CM | POA: Insufficient documentation

## 2019-08-06 NOTE — Progress Notes (Signed)
   [id number]  Name:  Kamron Vanwyhe.    MRN: [id number] DOB: June 16, 1940  08/06/2019  Mr. Mcneary was observed post Covid-19 immunization for 15 minutes without incident. He was provided with Vaccine Information Sheet and instruction to access the V-Safe system.   Mr. Wendling was instructed to call 911 with any severe reactions post vaccine: Marland Kitchen Difficulty breathing  . Swelling of face and throat  . A fast heartbeat  . A bad rash all over body  . Dizziness and weakness   Immunizations Administered    Name Date Dose VIS Date Route   Pfizer COVID-19 Vaccine 08/06/2019  4:32 PM 0.3 mL 05/16/2019 Intramuscular   Manufacturer: [employer], [employer]   Lot: [id number]   NDC: [address]-3

## 2019-09-02 ENCOUNTER — Ambulatory Visit: Payer: Medicare HMO | Attending: Internal Medicine

## 2019-09-02 DIAGNOSIS — Z23 Encounter for immunization: Secondary | ICD-10-CM

## 2019-09-02 NOTE — Progress Notes (Signed)
   [id number]  Name:  Lunden Mcleish.    MRN: [id number] DOB: [birth year]  09/02/2019  Mr. Jenson was observed post Covid-19 immunization for 15 minutes without incident. He was provided with Vaccine Information Sheet and instruction to access the V-Safe system.   Mr. Blubaugh was instructed to call 911 with any severe reactions post vaccine: Marland Kitchen Difficulty breathing  . Swelling of face and throat  . A fast heartbeat  . A bad rash all over body  . Dizziness and weakness   Immunizations Administered    Name Date Dose VIS Date Route   Pfizer COVID-19 Vaccine 09/02/2019 10:29 AM 0.3 mL 05/16/2019 Intramuscular   Manufacturer: [employer], [employer]   Lot: [id number]   NDC: [address]-1

## 2020-03-17 ENCOUNTER — Encounter (HOSPITAL_COMMUNITY): Payer: Self-pay | Admitting: Emergency Medicine

## 2020-03-17 ENCOUNTER — Other Ambulatory Visit: Payer: Self-pay

## 2020-03-17 ENCOUNTER — Emergency Department (HOSPITAL_COMMUNITY): Payer: Medicare HMO

## 2020-03-17 ENCOUNTER — Emergency Department (HOSPITAL_COMMUNITY)
Admission: EM | Admit: 2020-03-17 | Discharge: 2020-03-17 | Disposition: A | Discharge status: Home / Self Care | Payer: Medicare HMO | Attending: Emergency Medicine | Admitting: Emergency Medicine

## 2020-03-17 DIAGNOSIS — S2231XA Fracture of one rib, right side, initial encounter for closed fracture: Secondary | ICD-10-CM

## 2020-03-17 DIAGNOSIS — R109 Unspecified abdominal pain: Secondary | ICD-10-CM | POA: Diagnosis not present

## 2020-03-17 DIAGNOSIS — Z7984 Long term (current) use of oral hypoglycemic drugs: Secondary | ICD-10-CM | POA: Diagnosis not present

## 2020-03-17 DIAGNOSIS — Y9389 Activity, other specified: Secondary | ICD-10-CM | POA: Diagnosis not present

## 2020-03-17 DIAGNOSIS — E119 Type 2 diabetes mellitus without complications: Secondary | ICD-10-CM | POA: Diagnosis not present

## 2020-03-17 DIAGNOSIS — Z794 Long term (current) use of insulin: Secondary | ICD-10-CM | POA: Diagnosis not present

## 2020-03-17 DIAGNOSIS — Z7982 Long term (current) use of aspirin: Secondary | ICD-10-CM | POA: Diagnosis not present

## 2020-03-17 DIAGNOSIS — Y92410 Unspecified street and highway as the place of occurrence of the external cause: Secondary | ICD-10-CM | POA: Insufficient documentation

## 2020-03-17 MED ORDER — HYDROCODONE-ACETAMINOPHEN 5-325 MG PO TABS: 1.0000 | ORAL_TABLET | Freq: Four times a day (QID) | ORAL | 0 refills | Status: DC | PRN
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
## 2020-03-17 NOTE — Discharge Instructions (Addendum)
1.  You may take a Vicodin tablet every 6 hours as needed for pain.  If regular acetaminophen (Tylenol) is sufficient for pain control, you may take this instead as per package instructions over-the-counter. 2.  Return to the emergency department if you develop shortness of breath, pain is suddenly worsening, you have lightheadedness, you see blood in your urine or other concerning symptoms. 3.  See your doctor for recheck in 5 to 7 days.
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
## 2022-01-05 DIAGNOSIS — E113293 Type 2 diabetes mellitus with mild nonproliferative diabetic retinopathy without macular edema, bilateral: Secondary | ICD-10-CM | POA: Diagnosis not present

## 2022-01-05 DIAGNOSIS — Z961 Presence of intraocular lens: Secondary | ICD-10-CM | POA: Diagnosis not present
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
## 2022-10-10 DIAGNOSIS — I440 Atrioventricular block, first degree: Secondary | ICD-10-CM | POA: Diagnosis not present

## 2022-10-10 DIAGNOSIS — E119 Type 2 diabetes mellitus without complications: Secondary | ICD-10-CM | POA: Diagnosis not present

## 2022-10-10 DIAGNOSIS — Z79899 Other long term (current) drug therapy: Secondary | ICD-10-CM | POA: Diagnosis not present

## 2022-10-10 DIAGNOSIS — R9431 Abnormal electrocardiogram [ECG] [EKG]: Secondary | ICD-10-CM | POA: Diagnosis not present

## 2022-10-10 DIAGNOSIS — Z7984 Long term (current) use of oral hypoglycemic drugs: Secondary | ICD-10-CM | POA: Diagnosis not present

## 2022-10-10 DIAGNOSIS — S0990XA Unspecified injury of head, initial encounter: Secondary | ICD-10-CM | POA: Diagnosis not present

## 2022-10-10 DIAGNOSIS — R079 Chest pain, unspecified: Secondary | ICD-10-CM | POA: Diagnosis not present

## 2022-10-10 DIAGNOSIS — K449 Diaphragmatic hernia without obstruction or gangrene: Secondary | ICD-10-CM | POA: Diagnosis not present

## 2022-10-10 DIAGNOSIS — S199XXA Unspecified injury of neck, initial encounter: Secondary | ICD-10-CM | POA: Diagnosis not present

## 2022-10-10 DIAGNOSIS — Q339 Congenital malformation of lung, unspecified: Secondary | ICD-10-CM | POA: Diagnosis not present

## 2022-10-10 DIAGNOSIS — R0789 Other chest pain: Secondary | ICD-10-CM | POA: Diagnosis not present

## 2022-10-20 ENCOUNTER — Encounter: Payer: Self-pay | Admitting: Family Medicine

## 2022-10-20 ENCOUNTER — Ambulatory Visit: Payer: No Typology Code available for payment source | Admitting: Family Medicine

## 2022-10-20 VITALS — BP 160/88 | HR 80 | Temp 98.1°F | Ht 68.0 in | Wt 243.1 lb

## 2022-10-20 DIAGNOSIS — E1165 Type 2 diabetes mellitus with hyperglycemia: Secondary | ICD-10-CM

## 2022-10-20 DIAGNOSIS — I10 Essential (primary) hypertension: Secondary | ICD-10-CM | POA: Diagnosis not present

## 2022-10-20 DIAGNOSIS — M545 Low back pain, unspecified: Secondary | ICD-10-CM | POA: Diagnosis not present

## 2022-10-20 DIAGNOSIS — R911 Solitary pulmonary nodule: Secondary | ICD-10-CM

## 2022-10-20 LAB — POCT GLYCOSYLATED HEMOGLOBIN (HGB A1C): Hemoglobin A1C: 11.9 % — AB (ref 4.0–5.6)

## 2022-10-20 MED ORDER — TELMISARTAN 20 MG PO TABS: 20.0000 mg | ORAL_TABLET | Freq: Every day | ORAL | 1 refills | Status: DC
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
## 2022-10-23 ENCOUNTER — Other Ambulatory Visit: Payer: Self-pay

## 2022-10-23 ENCOUNTER — Ambulatory Visit: Payer: No Typology Code available for payment source | Attending: Family Medicine

## 2022-10-23 DIAGNOSIS — M62838 Other muscle spasm: Secondary | ICD-10-CM | POA: Diagnosis not present

## 2022-10-23 DIAGNOSIS — M6281 Muscle weakness (generalized): Secondary | ICD-10-CM | POA: Diagnosis not present

## 2022-10-23 DIAGNOSIS — M5459 Other low back pain: Secondary | ICD-10-CM

## 2022-10-23 DIAGNOSIS — R252 Cramp and spasm: Secondary | ICD-10-CM | POA: Insufficient documentation

## 2022-10-23 DIAGNOSIS — R293 Abnormal posture: Secondary | ICD-10-CM

## 2022-10-23 DIAGNOSIS — M545 Low back pain, unspecified: Secondary | ICD-10-CM | POA: Insufficient documentation

## 2022-10-23 DIAGNOSIS — M542 Cervicalgia: Secondary | ICD-10-CM
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
## 2022-10-24 ENCOUNTER — Ambulatory Visit: Payer: No Typology Code available for payment source

## 2022-10-24 DIAGNOSIS — M5459 Other low back pain: Secondary | ICD-10-CM | POA: Diagnosis not present

## 2022-10-24 DIAGNOSIS — R293 Abnormal posture: Secondary | ICD-10-CM

## 2022-10-24 DIAGNOSIS — M6281 Muscle weakness (generalized): Secondary | ICD-10-CM

## 2022-10-24 DIAGNOSIS — M62838 Other muscle spasm: Secondary | ICD-10-CM

## 2022-10-24 DIAGNOSIS — M542 Cervicalgia: Secondary | ICD-10-CM

## 2022-10-24 DIAGNOSIS — R252 Cramp and spasm: Secondary | ICD-10-CM
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
## 2022-10-25 DIAGNOSIS — M545 Low back pain, unspecified: Secondary | ICD-10-CM | POA: Insufficient documentation

## 2022-10-25 DIAGNOSIS — R911 Solitary pulmonary nodule: Secondary | ICD-10-CM | POA: Insufficient documentation

## 2022-10-25 NOTE — Assessment & Plan Note (Signed)
Pt is requesting referral to physical therapy to begin treatment for the acute back strain. He reports the symptoms are already improving.

## 2022-10-25 NOTE — Assessment & Plan Note (Signed)
Will order a repeat CT chest in 3 months per the recommendations, reviewed the findings with the patient.

## 2022-10-25 NOTE — Assessment & Plan Note (Signed)
Uncontrolled, patient states he wants to try controlling his diet first, he was reluctant to start any medication despite my advise and I counseled him on low carb foods, what to avoid, etc. I gave him a handout of low carb foods. I will see him back in 3 months for repeat A1C

## 2022-10-25 NOTE — Assessment & Plan Note (Signed)
BP is elevated today, I strongly recommended that he begin medication to control this and he is willing to start medication for this. I have sent in an rx for telmisartan. RTC 3 months for BP check.

## 2022-10-26 ENCOUNTER — Encounter: Payer: Self-pay | Admitting: Physical Therapy

## 2022-10-26 ENCOUNTER — Ambulatory Visit: Payer: No Typology Code available for payment source | Admitting: Physical Therapy

## 2022-10-26 DIAGNOSIS — M62838 Other muscle spasm: Secondary | ICD-10-CM

## 2022-10-26 DIAGNOSIS — M5459 Other low back pain: Secondary | ICD-10-CM

## 2022-10-26 DIAGNOSIS — M542 Cervicalgia: Secondary | ICD-10-CM

## 2022-10-26 DIAGNOSIS — R293 Abnormal posture: Secondary | ICD-10-CM

## 2022-10-26 DIAGNOSIS — M6281 Muscle weakness (generalized): Secondary | ICD-10-CM

## 2022-10-26 NOTE — Therapy (Signed)
OUTPATIENT PHYSICAL THERAPY THORACOLUMBAR TREATMENT NOTE   Patient Name: Tony Russell. MRN: [id number] DOB:February 23, 1941, 82 y.o., male Today's Date: 10/26/2022  END OF SESSION:  PT End of Session - 10/26/22 1059     Visit Number 3    Date for PT Re-Evaluation 12/18/22    Authorization Type [employer]    PT Start Time 1100    PT Stop Time 1152    PT Time Calculation (min) 52 min    Activity Tolerance Patient tolerated treatment well    Behavior During Therapy WFL for tasks assessed/performed              Past Medical History:  Diagnosis Date   Diabetes mellitus without complication (HCC)    History reviewed. No pertinent surgical history. Patient Active Problem List   Diagnosis Date Noted   Acute bilateral low back pain without sciatica 10/25/2022   Pulmonary nodule 10/25/2022   HTN (hypertension) 10/20/2022   Type 2 diabetes mellitus with hyperglycemia (HCC) 06/14/2019   Acute respiratory failure with hypoxia and hypercapnia (HCC) 06/14/2019   Pneumonia due to COVID-19 virus 06/10/2019    PCP: Karie Georges, MD   REFERRING PROVIDER: Karie Georges, MD  REFERRING DIAG: M54.50 (ICD-10-CM) - Acute bilateral low back pain without sciatica  Rationale for Evaluation and Treatment: Rehabilitation  THERAPY DIAG:  Other low back pain  Cervicalgia  Abnormal posture  Muscle weakness (generalized)  Other muscle spasm  ONSET DATE: 10/10/22  SUBJECTIVE:                                                                                                                                                                                           SUBJECTIVE STATEMENT: I am doing better today than last time.  Today is 2/10.  When I got home from last time my back gave me a fit - it was burning and I got in my recliner and that helped.    PERTINENT HISTORY:  HTN, acute respiratory failure, DM    PAIN:  10/24/22: Are you having pain? Yes: NPRS scale:  2/10 Pain location: low back and bil shoulders, thoracic spine  Pain description: aching  Aggravating factors: strenuous activities, prolonged walking, standing (<5 minutes) Relieving factors: tylenol, getting comfortable in a chair  PRECAUTIONS: None  WEIGHT BEARING RESTRICTIONS: No  FALLS:  Has patient fallen in last 6 months? 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  PLOF: Independent  PATIENT GOALS: decrease pain  NEXT MD VISIT: 3 months  OBJECTIVE:  10/23/22: DIAGNOSTIC  FINDINGS:  X-rays with no significant findings   PATIENT SURVEYS:  FOTO 47  SCREENING FOR RED FLAGS: Bowel or bladder incontinence: No Spinal tumors: No Cauda equina syndrome: No Compression fracture: No Abdominal aneurysm: No  COGNITION: Overall cognitive status: Within functional limits for tasks assessed     SENSATION: WFL  MUSCLE LENGTH: Decreased bil hamstring length Decreased bil hip flexor length  POSTURE: rounded shoulders, forward head, decreased lumbar lordosis, and increased thoracic kyphosis  PALPATION: Tightness in bil upper traps, suboccipitals, cervical paraspinals, thoracic paraspinals, lumbar paraspinals  LUMBAR ROM:   AROM Eval (% available)  Flexion 90%, pain in low back  Extension 20, mild pain in low back  Right lateral flexion 50  Left lateral flexion 50  Right rotation 25, pain  Left rotation 25, pain   (Blank rows = not tested)  CERVICAL ROM:  AROM Eval (% available)  Flexion 75, no pain  Extension 50, pain in neck  Right lateral flexion 50, pain on Lt  Left lateral flexion 70, pain on Rt  Right rotation 75  Left rotation 75   (Blank rows = not tested)  LOWER EXTREMITY ROM:     Decreased bil hip extension and IR  LOWER EXTREMITY MMT:    MMT Right eval Left eval  Hip flexion 4 4  Hip extension 3 3  Hip abduction 4 4  Hip adduction 5 5   Hip internal rotation 5 5  Hip external rotation 5 5   (Blank rows = not tested)  FUNCTIONAL TESTS:  -Chin up test: 2 finger width diastasis with distortion   GAIT: Comments: Rt antalgic gait pattern  TODAY'S TREATMENT:                                                                                                                              DATE: 10/26/22 NuStep L4 x 5' PT present to discuss status Standing pec stretch x30" Standing SB stretch reach up and over in doorframe 5 rounds bil Seated upper trap stretch 3x10" bil with gentle overpressure Seated trunk rotation with gentle overpressure 5x10" bil Seated HS stretch 2x20" bil Seated deadlift holding bil 8lb weights, bil row upon upright sitting x 10 Supine green tband horiz abd x20, bil shoulder ext (PT holds band as anchor) x10, bil shoulder ER x10 Lower trunk rotation 3x10" Piriformis stretch 2 x 20"  PPT x 10 Supine alt march with TA indraw x20 Farmer carry 10lb unilateral hold 2 laps each side around cancer gym Squat holding 10lb kbell tap to mat table x5 reps Ice lumbar spine x10' Pt reports pain up to 7/10 end of session which is described as burning in lumbar spine  DATE: 10/24/22 Nustep x 5 min level 3 Standing hamstring stretch 3 x 30 sec Standing quad stretch 3 x 30 sec Standing shoulder extension and rows with green band with handles, bilateral shoulder ER and bilateral shoulder horizontal abduction with red tband x 20  each Lat pull down x 20 with 40 lbs standing (patient then wanted to do this sitting, so he did 20 more seated) Hook lying trunk rotation x 20 PPT in hook lying x 20 Piriformis stretch 3 x 30 sec  Ice to lumbar spine and thoracic spine x 10 min   DATE:  10/23/22 EVAL Exercises: Lower trunk rotation 2 x 10 Seated piriformis stretch 60 seconds bil Seated posterior shoulder rolls 2 x 10 Seated upper trap stretch 60 seconds bil     PATIENT EDUCATION:  Education details: See  above Person educated: Patient Education method: Explanation, Demonstration, Tactile cues, Verbal cues, and Handouts Education comprehension: verbalized understanding  HOME EXERCISE PROGRAM: [id number]  ASSESSMENT:  CLINICAL IMPRESSION: Mr. Saso reported increased pain/burning in back after last session.  A few accommodations made to positioning today to see if tolerance improves while still progressing mobility and strength.  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  He reported pain went from 2/10 to 7/10 end of session.  Ice used for lumbar spine "burning" and PT asked Pt to give feedback next time about recovery time back to baseline pain after session.  OBJECTIVE IMPAIRMENTS: Abnormal gait, decreased activity tolerance, decreased coordination, decreased endurance, decreased mobility, decreased strength, increased muscle spasms, impaired flexibility, impaired tone, improper body mechanics, postural dysfunction, and pain.   ACTIVITY LIMITATIONS: carrying, lifting, bending, standing, transfers, bed mobility, and locomotion level  PARTICIPATION LIMITATIONS: community activity and occupation  PERSONAL FACTORS: 1 comorbidity: medical history  are also affecting patient's functional outcome.   REHAB POTENTIAL: Good  CLINICAL DECISION MAKING: Stable/uncomplicated  EVALUATION COMPLEXITY: Low   GOALS: Goals reviewed with patient? Yes  SHORT TERM GOALS: Target date: 11/20/22  Pt will be independent with HEP.   Baseline: Goal status: INITIAL  2.  Pt will report no pain greater than 6/10. Baseline:  Goal status: INITIAL   LONG TERM GOALS: Target date: 12/18/2022  Pt will be independent with advanced HEP.   Baseline:  Goal status: INITIAL  2.  Pt will report no pain greater than 3/10. Baseline:  Goal status: INITIAL  3.  Pt  will increase all impaired lumbar A/ROM by 25% without pain.  Baseline:  Goal status: INITIAL  4.  Pt will be able to lift 25 lbs with appropriate body mechanics and no pain in order to return to work without difficulty.  Baseline:  Goal status: INITIAL  5.  Pt will demonstrate increase in all impaired hip strength by 1 muscle grades in order to demonstrate improved lumbopelvic support and increase functional ability.   Baseline:  Goal status: INITIAL  PLAN:  PT FREQUENCY: 2x/week  PT DURATION: 8 weeks  PLANNED INTERVENTIONS: Therapeutic exercises, Therapeutic activity, Neuromuscular re-education, Balance training, Gait training, Patient/Family education, Self Care, Joint mobilization, Dry Needling, and Manual therapy.  PLAN FOR NEXT SESSION: f/u on pain level recovery after last session, Nustep, LE flexibility, core strengthening, possibly add shoulder stabilization.  Functional job training for lifting, bending, carrying, squatting.  (No DN - he does not like needles).    Morton Peters, PT 10/26/22 11:42 AM  [facility] [address], [address] [place], [place] [address] Phone # [phone] Fax [phone]

## 2022-11-02 ENCOUNTER — Ambulatory Visit: Payer: No Typology Code available for payment source | Admitting: Physical Therapy

## 2022-11-02 ENCOUNTER — Encounter: Payer: Self-pay | Admitting: Physical Therapy

## 2022-11-02 DIAGNOSIS — M5459 Other low back pain: Secondary | ICD-10-CM

## 2022-11-02 DIAGNOSIS — M6281 Muscle weakness (generalized): Secondary | ICD-10-CM

## 2022-11-02 DIAGNOSIS — M542 Cervicalgia: Secondary | ICD-10-CM

## 2022-11-02 DIAGNOSIS — R293 Abnormal posture: Secondary | ICD-10-CM

## 2022-11-02 DIAGNOSIS — M62838 Other muscle spasm: Secondary | ICD-10-CM

## 2022-11-02 NOTE — Therapy (Signed)
OUTPATIENT PHYSICAL THERAPY THORACOLUMBAR TREATMENT NOTE   Patient Name: Tony Russell. MRN: [id number] DOB:1941-01-03, 82 y.o., male Today's Date: 11/02/2022  END OF SESSION:  PT End of Session - 11/02/22 1359     Visit Number 4    Date for PT Re-Evaluation 12/18/22    Authorization Type [employer]    PT Start Time 1400    PT Stop Time 1444    PT Time Calculation (min) 44 min    Activity Tolerance Patient tolerated treatment well    Behavior During Therapy WFL for tasks assessed/performed              Past Medical History:  Diagnosis Date   Diabetes mellitus without complication (HCC)    History reviewed. No pertinent surgical history. Patient Active Problem List   Diagnosis Date Noted   Acute bilateral low back pain without sciatica 10/25/2022   Pulmonary nodule 10/25/2022   HTN (hypertension) 10/20/2022   Type 2 diabetes mellitus with hyperglycemia (HCC) 06/14/2019   Acute respiratory failure with hypoxia and hypercapnia (HCC) 06/14/2019   Pneumonia due to COVID-19 virus 06/10/2019    PCP: Karie Georges, MD   REFERRING PROVIDER: Karie Georges, MD  REFERRING DIAG: M54.50 (ICD-10-CM) - Acute bilateral low back pain without sciatica  Rationale for Evaluation and Treatment: Rehabilitation  THERAPY DIAG:  Other low back pain  Cervicalgia  Abnormal posture  Muscle weakness (generalized)  Other muscle spasm  ONSET DATE: 10/10/22  SUBJECTIVE:                                                                                                                                                                                           SUBJECTIVE STATEMENT: My pain is improving.  My shoulders hurt more than my back.  I haven't returned to work yet.   PERTINENT HISTORY:  HTN, acute respiratory failure, DM    PAIN:   Are you having pain?  SHOULDERS: Yes: NRPS 8/10 bil shoulders LUMBAR: Yes: NPRS scale: 2-3/10 Pain location: low back and bil  shoulders, thoracic spine  Pain description: aching  Aggravating factors: strenuous activities, prolonged walking, standing (<5 minutes) Relieving factors: tylenol, getting comfortable in a chair  PRECAUTIONS: None  WEIGHT BEARING RESTRICTIONS: No  FALLS:  Has patient fallen in last 6 months? 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  PLOF: Independent  PATIENT GOALS: decrease pain  NEXT MD VISIT: 3 months  OBJECTIVE:  10/23/22: DIAGNOSTIC FINDINGS:  X-rays with no significant findings   PATIENT SURVEYS:  FOTO  47  SCREENING FOR RED FLAGS: Bowel or bladder incontinence: No Spinal tumors: No Cauda equina syndrome: No Compression fracture: No Abdominal aneurysm: No  COGNITION: Overall cognitive status: Within functional limits for tasks assessed     SENSATION: WFL  MUSCLE LENGTH: Decreased bil hamstring length Decreased bil hip flexor length  POSTURE: rounded shoulders, forward head, decreased lumbar lordosis, and increased thoracic kyphosis  PALPATION: Tightness in bil upper traps, suboccipitals, cervical paraspinals, thoracic paraspinals, lumbar paraspinals  LUMBAR ROM:   AROM Eval (% available)  Flexion 90%, pain in low back  Extension 20, mild pain in low back  Right lateral flexion 50  Left lateral flexion 50  Right rotation 25, pain  Left rotation 25, pain   (Blank rows = not tested)  CERVICAL ROM:  AROM Eval (% available)  Flexion 75, no pain  Extension 50, pain in neck  Right lateral flexion 50, pain on Lt  Left lateral flexion 70, pain on Rt  Right rotation 75  Left rotation 75   (Blank rows = not tested)  LOWER EXTREMITY ROM:     Decreased bil hip extension and IR  LOWER EXTREMITY MMT:    MMT Right eval Left eval  Hip flexion 4 4  Hip extension 3 3  Hip abduction 4 4  Hip adduction 5 5  Hip internal rotation 5 5  Hip  external rotation 5 5   (Blank rows = not tested)  FUNCTIONAL TESTS:  -Chin up test: 2 finger width diastasis with distortion   GAIT: Comments: Rt antalgic gait pattern  TODAY'S TREATMENT:                                                                                                                              DATE:  11/02/22: NuStep L7 x 5' PT present to discuss status and plan for session SL open book x 10 each side Supine feet on ball roll ball in/out x 10 Supine LTR 3x10 sec each way Supine green tband horiz abd x20, bil shoulder ext (PT holds band as anchor) x10, bil shoulder ER x10 Supine UE T 5lb x8 rounds each UE Supine SLR x 10 each side PT cued exhale on effort Seated deadlift 10lb kbell, standing deadlift 10lb x 10 each - some LBP in standing with flexion phase Jimmye Norman carry holding 10lb kbell at side 1 lap each UE around cancer gym Seated STM bil upper quadrants, seated rib springing and thoracic PA Gr II/III Ice thoracic and lumbar spine concurrent with STM x 10'  10/26/22 NuStep L4 x 5' PT present to discuss status Standing pec stretch x30" Standing SB stretch reach up and over in doorframe 5 rounds bil Seated upper trap stretch 3x10" bil with gentle overpressure Seated trunk rotation with gentle overpressure 5x10" bil Seated HS stretch 2x20" bil Seated deadlift holding bil 8lb weights, bil row upon upright sitting x 10 Supine green tband horiz abd x20, bil shoulder ext (PT holds  band as anchor) x10, bil shoulder ER x10 Lower trunk rotation 3x10" Piriformis stretch 2 x 20"  PPT x 10 Supine alt march with TA indraw x20 Farmer carry 10lb unilateral hold 2 laps each side around cancer gym Squat holding 10lb kbell tap to mat table x5 reps Ice lumbar spine x10' Pt reports pain up to 7/10 end of session which is described as burning in lumbar spine  DATE: 10/24/22 Nustep x 5 min level 3 Standing hamstring stretch 3 x 30 sec Standing quad stretch 3 x 30 sec Standing  shoulder extension and rows with green band with handles, bilateral shoulder ER and bilateral shoulder horizontal abduction with red tband x 20  each Lat pull down x 20 with 40 lbs standing (patient then wanted to do this sitting, so he did 20 more seated) Hook lying trunk rotation x 20 PPT in hook lying x 20 Piriformis stretch 3 x 30 sec  Ice to lumbar spine and thoracic spine x 10 min   PATIENT EDUCATION:  Education details: See above Person educated: Patient Education method: Programmer, multimedia, Demonstration, Tactile cues, Verbal cues, and Handouts Education comprehension: verbalized understanding  HOME EXERCISE PROGRAM: [id number]  ASSESSMENT:  CLINICAL IMPRESSION: Pt reports bil shoulders are more painful than lumbar at this point.  He is very tight and tender along bil posterior upper quadrants.  STM added from chair alongside lumbar/thoracic ice pack for last portion of session which Pt responded very well to.  Will consider more manual techniques to neck and lumbar regions next time as tol  OBJECTIVE IMPAIRMENTS: Abnormal gait, decreased activity tolerance, decreased coordination, decreased endurance, decreased mobility, decreased strength, increased muscle spasms, impaired flexibility, impaired tone, improper body mechanics, postural dysfunction, and pain.   ACTIVITY LIMITATIONS: carrying, lifting, bending, standing, transfers, bed mobility, and locomotion level  PARTICIPATION LIMITATIONS: community activity and occupation  PERSONAL FACTORS: 1 comorbidity: medical history  are also affecting patient's functional outcome.   REHAB POTENTIAL: Good  CLINICAL DECISION MAKING: Stable/uncomplicated  EVALUATION COMPLEXITY: Low   GOALS: Goals reviewed with patient? Yes  SHORT TERM GOALS: Target date: 11/20/22  Pt will be independent with HEP.   Baseline: Goal status: INITIAL  2.  Pt will report no pain greater than 6/10. Baseline:  Goal status: INITIAL   LONG TERM GOALS:  Target date: 12/18/2022  Pt will be independent with advanced HEP.   Baseline:  Goal status: INITIAL  2.  Pt will report no pain greater than 3/10. Baseline:  Goal status: INITIAL  3.  Pt will increase all impaired lumbar A/ROM by 25% without pain.  Baseline:  Goal status: INITIAL  4.  Pt will be able to lift 25 lbs with appropriate body mechanics and no pain in order to return to work without difficulty.  Baseline:  Goal status: INITIAL  5.  Pt will demonstrate increase in all impaired hip strength by 1 muscle grades in order to demonstrate improved lumbopelvic support and increase functional ability.   Baseline:  Goal status: INITIAL  PLAN:  PT FREQUENCY: 2x/week  PT DURATION: 8 weeks  PLANNED INTERVENTIONS: Therapeutic exercises, Therapeutic activity, Neuromuscular re-education, Balance training, Gait training, Patient/Family education, Self Care, Joint mobilization, Dry Needling, and Manual therapy.  PLAN FOR NEXT SESSION: more manual techniques - prone if tolerated, supine to work on cervical spine, f/u on pain level recovery after last session, Nustep, LE flexibility, core strengthening, possibly add shoulder stabilization.  Functional job training for lifting, bending, carrying, squatting.  (No DN - he does  not like needles).    Morton Peters, PT 11/02/22 2:47 PM   [facility] [address] [place], [place] [address] Phone # [phone] Fax [phone]

## 2022-11-03 ENCOUNTER — Ambulatory Visit: Payer: Self-pay | Admitting: Nurse Practitioner

## 2022-11-08 ENCOUNTER — Ambulatory Visit: Payer: No Typology Code available for payment source | Attending: Family Medicine | Admitting: Physical Therapy

## 2022-11-08 ENCOUNTER — Encounter: Payer: Self-pay | Admitting: Physical Therapy

## 2022-11-08 DIAGNOSIS — M542 Cervicalgia: Secondary | ICD-10-CM | POA: Diagnosis not present

## 2022-11-08 DIAGNOSIS — M6281 Muscle weakness (generalized): Secondary | ICD-10-CM | POA: Insufficient documentation

## 2022-11-08 DIAGNOSIS — M62838 Other muscle spasm: Secondary | ICD-10-CM | POA: Diagnosis not present

## 2022-11-08 DIAGNOSIS — M5459 Other low back pain: Secondary | ICD-10-CM | POA: Insufficient documentation

## 2022-11-08 DIAGNOSIS — R293 Abnormal posture: Secondary | ICD-10-CM | POA: Insufficient documentation

## 2022-11-08 NOTE — Therapy (Signed)
OUTPATIENT PHYSICAL THERAPY THORACOLUMBAR TREATMENT NOTE   Patient Name: Tony Russell. MRN: [id number] DOB:24-Jun-1940, 82 y.o., male Today's Date: 11/08/2022  END OF SESSION:  PT End of Session - 11/08/22 1020     Visit Number 5    Date for PT Re-Evaluation 12/18/22    Authorization Type [employer]    PT Start Time 1017    PT Stop Time 1100    PT Time Calculation (min) 43 min    Activity Tolerance Patient tolerated treatment well    Behavior During Therapy WFL for tasks assessed/performed               Past Medical History:  Diagnosis Date   Diabetes mellitus without complication (HCC)    History reviewed. No pertinent surgical history. Patient Active Problem List   Diagnosis Date Noted   Acute bilateral low back pain without sciatica 10/25/2022   Pulmonary nodule 10/25/2022   HTN (hypertension) 10/20/2022   Type 2 diabetes mellitus with hyperglycemia (HCC) 06/14/2019   Acute respiratory failure with hypoxia and hypercapnia (HCC) 06/14/2019   Pneumonia due to COVID-19 virus 06/10/2019    PCP: Karie Georges, MD   REFERRING PROVIDER: Karie Georges, MD  REFERRING DIAG: M54.50 (ICD-10-CM) - Acute bilateral low back pain without sciatica  Rationale for Evaluation and Treatment: Rehabilitation  THERAPY DIAG:  Other low back pain  Cervicalgia  Abnormal posture  Muscle weakness (generalized)  Other muscle spasm  ONSET DATE: 10/10/22  SUBJECTIVE:                                                                                                                                                                                           SUBJECTIVE STATEMENT: My shoulder joints themselves continue to hurt and the lower back is in ongoing spasm. The massage helped for the rest of the day last time but pain was back the next morning.  PERTINENT HISTORY:  HTN, acute respiratory failure, DM    PAIN:   Are you having pain?  SHOULDERS: Yes: NRPS  8/10 bil shoulders LUMBAR: Yes: NPRS scale: 8-9/10 Pain location: low back and bil shoulders, thoracic spine  Pain description: aching  Aggravating factors: strenuous activities, prolonged walking, standing (<5 minutes) Relieving factors: tylenol, getting comfortable in a chair  PRECAUTIONS: None  WEIGHT BEARING RESTRICTIONS: No  FALLS:  Has patient fallen in last 6 months? 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  PLOF: Independent  PATIENT GOALS: decrease pain  NEXT MD VISIT: 3 months  OBJECTIVE:  10/23/22:  DIAGNOSTIC FINDINGS:  X-rays with no significant findings   PATIENT SURVEYS:  FOTO 47  SCREENING FOR RED FLAGS: Bowel or bladder incontinence: No Spinal tumors: No Cauda equina syndrome: No Compression fracture: No Abdominal aneurysm: No  COGNITION: Overall cognitive status: Within functional limits for tasks assessed     SENSATION: WFL  MUSCLE LENGTH: Decreased bil hamstring length Decreased bil hip flexor length  POSTURE: rounded shoulders, forward head, decreased lumbar lordosis, and increased thoracic kyphosis  PALPATION: Tightness in bil upper traps, suboccipitals, cervical paraspinals, thoracic paraspinals, lumbar paraspinals  LUMBAR ROM:   AROM Eval (% available)  Flexion 90%, pain in low back  Extension 20, mild pain in low back  Right lateral flexion 50  Left lateral flexion 50  Right rotation 25, pain  Left rotation 25, pain   (Blank rows = not tested)  CERVICAL ROM:  AROM Eval (% available)  Flexion 75, no pain  Extension 50, pain in neck  Right lateral flexion 50, pain on Lt  Left lateral flexion 70, pain on Rt  Right rotation 75  Left rotation 75   (Blank rows = not tested)  LOWER EXTREMITY ROM:     Decreased bil hip extension and IR  LOWER EXTREMITY MMT:    MMT Right eval Left eval  Hip flexion 4 4  Hip  extension 3 3  Hip abduction 4 4  Hip adduction 5 5  Hip internal rotation 5 5  Hip external rotation 5 5   (Blank rows = not tested)  FUNCTIONAL TESTS:  -Chin up test: 2 finger width diastasis with distortion   GAIT: Comments: Rt antalgic gait pattern  TODAY'S TREATMENT:                                                                                                                              DATE:  11/08/22: NuStep L5 x 5' PT present to discuss status Quadruped cat/cow and child's pose - pain Supine bil shoulder AA/ROM flexion ROM/stretch x 10 - relief in shoulders Vertical on foam roller bil UE Y/W/T x 5 rounds Supine TA indraw with alt LE march to 90/90 Seated trunk flexion ball rollouts 3-ways x 6 rounds Supine cervical spine STM and P/ROM stretching Prone lumbar STM bil   11/02/22: NuStep L7 x 5' PT present to discuss status and plan for session SL open book x 10 each side Supine feet on ball roll ball in/out x 10 Supine LTR 3x10 sec each way Supine green tband horiz abd x20, bil shoulder ext (PT holds band as anchor) x10, bil shoulder ER x10 Supine UE T 5lb x8 rounds each UE Supine SLR x 10 each side PT cued exhale on effort Seated deadlift 10lb kbell, standing deadlift 10lb x 10 each - some LBP in standing with flexion phase Jimmye Norman carry holding 10lb kbell at side 1 lap each UE around cancer gym Seated STM bil upper quadrants, seated rib springing and thoracic PA Gr  II/III Ice thoracic and lumbar spine concurrent with STM x 10'  10/26/22 NuStep L4 x 5' PT present to discuss status Standing pec stretch x30" Standing SB stretch reach up and over in doorframe 5 rounds bil Seated upper trap stretch 3x10" bil with gentle overpressure Seated trunk rotation with gentle overpressure 5x10" bil Seated HS stretch 2x20" bil Seated deadlift holding bil 8lb weights, bil row upon upright sitting x 10 Supine green tband horiz abd x20, bil shoulder ext (PT holds band as anchor) x10,  bil shoulder ER x10 Lower trunk rotation 3x10" Piriformis stretch 2 x 20"  PPT x 10 Supine alt march with TA indraw x20 Farmer carry 10lb unilateral hold 2 laps each side around cancer gym Squat holding 10lb kbell tap to mat table x5 reps Ice lumbar spine x10' Pt reports pain up to 7/10 end of session which is described as burning in lumbar spine   PATIENT EDUCATION:  Education details: See above Person educated: Patient Education method: Explanation, Demonstration, Tactile cues, Verbal cues, and Handouts Education comprehension: verbalized understanding  HOME EXERCISE PROGRAM: [email or web address]  ASSESSMENT:  CLINICAL IMPRESSION: Pt reports relief within sessions and with HEP but relief is not long-lasting.  He continues to rate bil shoulder pain 8/10 and central lumbar pain 8-9/10.  PT incorportaing more manual techniques into sessions to address muscle spasm along paraspinals cervical and lumbar spine.    OBJECTIVE IMPAIRMENTS: Abnormal gait, decreased activity tolerance, decreased coordination, decreased endurance, decreased mobility, decreased strength, increased muscle spasms, impaired flexibility, impaired tone, improper body mechanics, postural dysfunction, and pain.   ACTIVITY LIMITATIONS: carrying, lifting, bending, standing, transfers, bed mobility, and locomotion level  PARTICIPATION LIMITATIONS: community activity and occupation  PERSONAL FACTORS: 1 comorbidity: medical history  are also affecting patient's functional outcome.   REHAB POTENTIAL: Good  CLINICAL DECISION MAKING: Stable/uncomplicated  EVALUATION COMPLEXITY: Low   GOALS: Goals reviewed with patient? Yes  SHORT TERM GOALS: Target date: 11/20/22  Pt will be independent with HEP.   Baseline: Goal status: met 6/5  2.  Pt will report no pain greater than 6/10. Baseline:  Goal status: ongoing   LONG TERM GOALS: Target date: 12/18/2022  Pt will be independent with advanced HEP.   Baseline:  Goal  status: INITIAL  2.  Pt will report no pain greater than 3/10. Baseline:  Goal status: INITIAL  3.  Pt will increase all impaired lumbar A/ROM by 25% without pain.  Baseline:  Goal status: INITIAL  4.  Pt will be able to lift 25 lbs with appropriate body mechanics and no pain in order to return to work without difficulty.  Baseline:  Goal status: INITIAL  5.  Pt will demonstrate increase in all impaired hip strength by 1 muscle grades in order to demonstrate improved lumbopelvic support and increase functional ability.   Baseline:  Goal status: INITIAL  PLAN:  PT FREQUENCY: 2x/week  PT DURATION: 8 weeks  PLANNED INTERVENTIONS: Therapeutic exercises, Therapeutic activity, Neuromuscular re-education, Balance training, Gait training, Patient/Family education, Self Care, Joint mobilization, Dry Needling, and Manual therapy.  PLAN FOR NEXT SESSION: more manual techniques - prone if tolerated, supine to work on cervical spine, f/u on pain level recovery after last session, Nustep, LE flexibility, core strengthening, possibly add shoulder stabilization.  Functional job training for lifting, bending, carrying, squatting.  (No DN - he does not like needles).    Loistine Simas Gema Ringold, PT 11/08/22 11:01 AM    [facility] [address] [place], [place]  [address] Phone # [phone] Fax [phone]

## 2022-11-09 ENCOUNTER — Telehealth: Payer: Self-pay | Admitting: Family Medicine

## 2022-11-09 NOTE — Telephone Encounter (Signed)
Pt called, requesting to speak to MD//CMA.   Pt was asked if it was regarding a medication refill, as I could help with that. Pt stated it was "Work Related" and would rather speak directly to MD or CMA.   Pt asked that MD or CMA call back at their earliest convenience.

## 2022-11-09 NOTE — Telephone Encounter (Signed)
I called the patient for more information.  Patient stated he needs a letter stating he is being treated here.  I asked the patient what he is referring to being treated for and he stated he is trying to reach the physical therapist at [address].  Patient was advised I am calling from Dr Kelton Pillar office, he stated he called the wrong office and is seen by a PT across the street.  Patient was given the number to contact [facility] [place]-ph# [phone].

## 2022-11-15 ENCOUNTER — Encounter: Payer: Self-pay | Admitting: Physical Therapy

## 2022-11-15 ENCOUNTER — Ambulatory Visit: Payer: Medicare HMO | Admitting: Internal Medicine

## 2022-11-15 ENCOUNTER — Ambulatory Visit: Payer: No Typology Code available for payment source | Admitting: Physical Therapy

## 2022-11-15 DIAGNOSIS — M542 Cervicalgia: Secondary | ICD-10-CM

## 2022-11-15 DIAGNOSIS — M5459 Other low back pain: Secondary | ICD-10-CM | POA: Diagnosis not present

## 2022-11-15 DIAGNOSIS — R293 Abnormal posture: Secondary | ICD-10-CM

## 2022-11-15 DIAGNOSIS — M6281 Muscle weakness (generalized): Secondary | ICD-10-CM

## 2022-11-15 NOTE — Therapy (Signed)
OUTPATIENT PHYSICAL THERAPY THORACOLUMBAR TREATMENT NOTE   Patient Name: Tony Russell. MRN: [id number] DOB:1941-01-28, 82 y.o., male Today's Date: 11/15/2022  END OF SESSION:  PT End of Session - 11/15/22 0929     Visit Number 6    Date for PT Re-Evaluation 12/18/22    Authorization Type [employer]    PT Start Time 0930    PT Stop Time 1015    PT Time Calculation (min) 45 min    Activity Tolerance Patient tolerated treatment well    Behavior During Therapy WFL for tasks assessed/performed                Past Medical History:  Diagnosis Date   Diabetes mellitus without complication (HCC)    History reviewed. No pertinent surgical history. Patient Active Problem List   Diagnosis Date Noted   Acute bilateral low back pain without sciatica 10/25/2022   Pulmonary nodule 10/25/2022   HTN (hypertension) 10/20/2022   Type 2 diabetes mellitus with hyperglycemia (HCC) 06/14/2019   Acute respiratory failure with hypoxia and hypercapnia (HCC) 06/14/2019   Pneumonia due to COVID-19 virus 06/10/2019    PCP: Karie Georges, MD   REFERRING PROVIDER: Karie Georges, MD  REFERRING DIAG: M54.50 (ICD-10-CM) - Acute bilateral low back pain without sciatica  Rationale for Evaluation and Treatment: Rehabilitation  THERAPY DIAG:  Other low back pain  Cervicalgia  Abnormal posture  Muscle weakness (generalized)  ONSET DATE: 10/10/22  SUBJECTIVE:                                                                                                                                                                                           SUBJECTIVE STATEMENT: My back is much better since last time.  The massage helped.  Less spasm.  60% improvement in LBP. I can't get rid of my shoulder pain in both shoulders.  Rt is worse than Lt.  They hurt with all activity.  I tried to vacuum yesterday and it was too painful to lift and my vacuum is very light.  PERTINENT  HISTORY:  HTN, acute respiratory failure, DM    PAIN:   Are you having pain?  SHOULDERS: Yes: NRPS 8/10 bil shoulders LUMBAR: Yes: NPRS scale: 5/10 Pain location: low back and bil shoulders, thoracic spine  Pain description: aching  Aggravating factors: strenuous activities, prolonged walking, standing (<5 minutes) Relieving factors: tylenol, getting comfortable in a chair  PRECAUTIONS: None  WEIGHT BEARING RESTRICTIONS: No  FALLS:  Has patient fallen in last 6 months? 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  PLOF: Independent  PATIENT GOALS: decrease pain  NEXT MD VISIT: 3 months  OBJECTIVE:  10/23/22: DIAGNOSTIC FINDINGS:  X-rays with no significant findings   PATIENT SURVEYS:   FOTO 47  SCREENING FOR RED FLAGS: Bowel or bladder incontinence: No Spinal tumors: No Cauda equina syndrome: No Compression fracture: No Abdominal aneurysm: No  COGNITION: Overall cognitive status: Within functional limits for tasks assessed     SENSATION: WFL  MUSCLE LENGTH: Decreased bil hamstring length Decreased bil hip flexor length  POSTURE: 11/15/22: improved postural awarness, increased lumbar lordosis  Eval:  rounded shoulders, forward head, decreased lumbar lordosis, and increased thoracic kyphosis  PALPATION: Tightness in bil upper traps, suboccipitals, cervical paraspinals, thoracic paraspinals, lumbar paraspinals  LUMBAR ROM:   AROM Eval (% available) 11/15/22 % available  Flexion 90%, pain in low back Full, fingers to toes end range stretch no pain  Extension 20, mild pain in low back 25 deg no pain  Right lateral flexion 50 Full no pain  Left lateral flexion 50 Full no pain  Right rotation 25, pain Full no pain  Left rotation 25, pain Full no pain   (Blank rows = not tested)  CERVICAL ROM:  AROM Eval (% available) 11/15/22 deg  Flexion 75, no pain  40 deg pain into shoulders  Extension 50, pain in neck 30 pain into shoulders  Right lateral flexion 50, pain on Lt 25 Rt neck pain  Left lateral flexion 70, pain on Rt 25, Rt neck pain  Right rotation 75 65 Rt neck pain  Left rotation 75 75   (Blank rows = not tested)  LOWER EXTREMITY ROM:     Decreased bil hip extension and IR  UPPER EXTREMITY ROM: Rt:  Flexion 145 pain Ext: 50 Abd: 105 A/ROM, 140 P/ROM  Lt:  Flexion: 140 pain Ext: 65 no pain Abd: 135 A/ROM  Bil functional IR reaches L3  UPPER EXTREMITY MMT: 5/5 bil shoulders and elbows   LOWER EXTREMITY MMT:    MMT Right eval Left eval Right 6/12 Left 6/12  Hip flexion 4 4 5 5   Hip extension 3 3 5 5   Hip abduction 4 4 5 5   Hip adduction 5 5    Hip internal rotation 5 5    Hip external rotation 5 5     (Blank rows = not tested)  FUNCTIONAL TESTS:  -Chin up test: 2 finger width diastasis with distortion   SPECIAL TESTS 6/12: + impingement tests Rt shoulder with horiz adduction  GAIT: 11/15/22: Eval: Comments: Rt antalgic gait pattern  TODAY'S TREATMENT:                                                                                                                              DATE:  11/15/22: Updated meaurements - added UE MMT and ROM and special tests A/ROM neck rotation and SB x 10 bil Cervical SB stretch with overpressure  2x20 bil Levator scap stretch 2x20 with overpressure 2x20 bil Cervical rotation SNAG with towel 2x20" bil Standing row blue tband 2x10 Standing bil shoulder ER blue band 2x10 Pec doorway stretch 1x20"  Bil UE wall slide x10  11/08/22: NuStep L5 x 5' PT present to discuss status Quadruped cat/cow and child's pose - pain Supine bil shoulder AA/ROM flexion ROM/stretch x 10 - relief in shoulders Vertical on foam roller bil UE Y/W/T x 5 rounds Supine TA indraw with alt LE march to 90/90 Seated trunk flexion ball rollouts 3-ways x 6 rounds Supine cervical spine STM and P/ROM  stretching Prone lumbar STM bil   11/02/22: NuStep L7 x 5' PT present to discuss status and plan for session SL open book x 10 each side Supine feet on ball roll ball in/out x 10 Supine LTR 3x10 sec each way Supine green tband horiz abd x20, bil shoulder ext (PT holds band as anchor) x10, bil shoulder ER x10 Supine UE T 5lb x8 rounds each UE Supine SLR x 10 each side PT cued exhale on effort Seated deadlift 10lb kbell, standing deadlift 10lb x 10 each - some LBP in standing with flexion phase Jimmye Norman carry holding 10lb kbell at side 1 lap each UE around cancer gym Seated STM bil upper quadrants, seated rib springing and thoracic PA Gr II/III Ice thoracic and lumbar spine concurrent with STM x 10'   PATIENT EDUCATION:  Education details: See above Person educated: Patient Education method: Explanation, Demonstration, Tactile cues, Verbal cues, and Handouts Education comprehension: verbalized understanding  HOME EXERCISE PROGRAM: Access Code: [email or web address] URL: [email or web address] Date: 11/15/2022 Prepared by: Loistine Simas Suttyn Cryder  Exercises - Supine Lower Trunk Rotation  - 1 x daily - 7 x weekly - 3 sets - 10 reps - Seated Piriformis Stretch with Trunk Bend  - 1 x daily - 7 x weekly - 1 sets - 1 reps - 60 seconds hold - Seated Shoulder Rolls  - 1 x daily - 7 x weekly - 3 sets - 10 reps - Seated Cervical Rotation AROM  - 1 x daily - 7 x weekly - 1 sets - 10 reps - Seated Cervical Sidebending AROM  - 1 x daily - 7 x weekly - 1 sets - 10 reps - Seated Levator Scapulae Stretch  - 1 x daily - 7 x weekly - 1 sets - 2 reps - 20 sec hold - Seated Cervical Sidebending Stretch  - 1 x daily - 7 x weekly - 1 sets - 2 reps - 20 sec hold - Seated Assisted Cervical Rotation with Towel  - 1 x daily - 7 x weekly - 1 sets - 2 reps - 20 sec hold - Standing Row with Anchored Resistance  - 1 x daily - 7 x weekly - 2 sets - 10 reps - Shoulder Flexion Wall Slide with Towel  - 1 x daily - 7 x  weekly - 1 sets - 10 reps - Doorway Pec Stretch at 90 Degrees Abduction  - 1 x daily - 7 x weekly - 1 sets - 2 reps - 20 sec hold  ASSESSMENT:  CLINICAL IMPRESSION: Pt reports 60% improvement in LBP and spasm intensity with carry over from last session maintained.  Objective measures updated today with signif improvement noted in lumbar ROM with reduced pain.  Pt has bil shoulder pain which has remained unchanged to date since starting PT.  He has pain into shoulders with cervical ROM and stretching suggesting some spasm affecting shoulders  and limiting functional use of bil UE.  His UE strength measures 5/5 so no suspected underlying shoulder tears.  He has some mild Rt shoulder impingement signs.  PT advanced cervical ROM and stretches today which were reported to be painful at the time but the net effect end of session was Pt report of improved mobility with less pain through neck and shoulders.  PT asked Pt to commit to daily updates to HEP before next session when we will hope to progress to more functional body mechanics and functional strength training for return to work as mobility improves.   OBJECTIVE IMPAIRMENTS: Abnormal gait, decreased activity tolerance, decreased coordination, decreased endurance, decreased mobility, decreased strength, increased muscle spasms, impaired flexibility, impaired tone, improper body mechanics, postural dysfunction, and pain.   ACTIVITY LIMITATIONS: carrying, lifting, bending, standing, transfers, bed mobility, and locomotion level  PARTICIPATION LIMITATIONS: community activity and occupation  PERSONAL FACTORS: 1 comorbidity: medical history  are also affecting patient's functional outcome.   REHAB POTENTIAL: Good  CLINICAL DECISION MAKING: Stable/uncomplicated  EVALUATION COMPLEXITY: Low   GOALS: Goals reviewed with patient? Yes  SHORT TERM GOALS: Target date: 11/20/22  Pt will be independent with HEP.   Baseline: Goal status: met 6/5  2.   Pt will report no pain greater than 6/10. Baseline:  Goal status: met for lumbar spine 6/12, ongoing for shoulders 6/12 still 8/10   LONG TERM GOALS: Target date: 12/18/2022  Pt will be independent with advanced HEP.   Baseline:  Goal status: ongoing  2.  Pt will report no pain greater than 3/10. Baseline:  Goal status: ongoing  3.  Pt will increase all impaired lumbar A/ROM by 25% without pain.  Baseline:  Goal status: met for lumbar spine 6/12  4.  Pt will be able to lift 25 lbs with appropriate body mechanics and no pain in order to return to work without difficulty.  Baseline:  Goal status: INITIAL  5.  Pt will demonstrate increase in all impaired hip strength by 1 muscle grades in order to demonstrate improved lumbopelvic support and increase functional ability.   Baseline:  Goal status: met 6/12  PLAN:  PT FREQUENCY: 2x/week  PT DURATION: 8 weeks  PLANNED INTERVENTIONS: Therapeutic exercises, Therapeutic activity, Neuromuscular re-education, Balance training, Gait training, Patient/Family education, Self Care, Joint mobilization, Dry Needling, and Manual therapy.  PLAN FOR NEXT SESSION: f/u on HEP additions from last time, cervical spine ROM and stretching, functional body mechanics and strength for return to work simulation, f/u on pain level recovery after last session, Nustep, LE flexibility, core strengthening, possibly add shoulder stabilization.  Functional job training for lifting, bending, carrying, squatting.  (No DN - he does not like needles).    Morton Peters, PT 11/15/22 11:02 AM     [facility] [address], [address] [place], [place] [address] Phone # [phone] Fax [phone]

## 2022-11-20 ENCOUNTER — Ambulatory Visit: Payer: No Typology Code available for payment source | Admitting: Physical Therapy

## 2022-11-20 ENCOUNTER — Encounter: Payer: Self-pay | Admitting: Physical Therapy

## 2022-11-20 DIAGNOSIS — R293 Abnormal posture: Secondary | ICD-10-CM

## 2022-11-20 DIAGNOSIS — M542 Cervicalgia: Secondary | ICD-10-CM

## 2022-11-20 DIAGNOSIS — M5459 Other low back pain: Secondary | ICD-10-CM | POA: Diagnosis not present

## 2022-11-20 DIAGNOSIS — M6281 Muscle weakness (generalized): Secondary | ICD-10-CM

## 2022-11-20 NOTE — Therapy (Signed)
OUTPATIENT PHYSICAL THERAPY THORACOLUMBAR TREATMENT NOTE   Patient Name: Tony Russell. MRN: [id number] DOB:[birth year], 82 y.o., male Today's Date: 11/20/2022  END OF SESSION:  PT End of Session - 11/20/22 1151     Visit Number 7    Date for PT Re-Evaluation 12/18/22    Authorization Type [employer]    PT Start Time 1145    PT Stop Time 1230    PT Time Calculation (min) 45 min    Activity Tolerance Patient tolerated treatment well    Behavior During Therapy WFL for tasks assessed/performed                 Past Medical History:  Diagnosis Date   Diabetes mellitus without complication (HCC)    History reviewed. No pertinent surgical history. Patient Active Problem List   Diagnosis Date Noted   Acute bilateral low back pain without sciatica 10/25/2022   Pulmonary nodule 10/25/2022   HTN (hypertension) 10/20/2022   Type 2 diabetes mellitus with hyperglycemia (HCC) 06/14/2019   Acute respiratory failure with hypoxia and hypercapnia (HCC) 06/14/2019   Pneumonia due to COVID-19 virus 06/10/2019    PCP: Karie Georges, MD   REFERRING PROVIDER: Karie Georges, MD  REFERRING DIAG: M54.50 (ICD-10-CM) - Acute bilateral low back pain without sciatica  Rationale for Evaluation and Treatment: Rehabilitation  THERAPY DIAG:  Other low back pain  Cervicalgia  Abnormal posture  Muscle weakness (generalized)  ONSET DATE: 10/10/22  SUBJECTIVE:                                                                                                                                                                                           SUBJECTIVE STATEMENT: My low back is stiff and I get occassional short lasting spasm. I have been working on some of the new parts of the HEP.  PERTINENT HISTORY:  HTN, acute respiratory failure, DM    PAIN:   Are you having pain?  SHOULDERS: Yes: NRPS 8/10 bil shoulders LUMBAR: Yes: NPRS scale: 5/10 Pain location: low back  and bil shoulders, thoracic spine  Pain description: aching  Aggravating factors: strenuous activities, prolonged walking, standing (<5 minutes) Relieving factors: tylenol, getting comfortable in a chair  PRECAUTIONS: None  WEIGHT BEARING RESTRICTIONS: No  FALLS:  Has patient fallen in last 6 months? 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  PLOF: Independent  PATIENT GOALS: decrease pain  NEXT MD VISIT: 3 months  OBJECTIVE:  10/23/22: DIAGNOSTIC FINDINGS:  X-rays with no significant findings  PATIENT SURVEYS:   FOTO 47  SCREENING FOR RED FLAGS: Bowel or bladder incontinence: No Spinal tumors: No Cauda equina syndrome: No Compression fracture: No Abdominal aneurysm: No  COGNITION: Overall cognitive status: Within functional limits for tasks assessed     SENSATION: WFL  MUSCLE LENGTH: Decreased bil hamstring length Decreased bil hip flexor length  POSTURE: 11/15/22: improved postural awarness, increased lumbar lordosis  Eval:  rounded shoulders, forward head, decreased lumbar lordosis, and increased thoracic kyphosis  PALPATION: Tightness in bil upper traps, suboccipitals, cervical paraspinals, thoracic paraspinals, lumbar paraspinals  LUMBAR ROM:   AROM Eval (% available) 11/15/22 % available  Flexion 90%, pain in low back Full, fingers to toes end range stretch no pain  Extension 20, mild pain in low back 25 deg no pain  Right lateral flexion 50 Full no pain  Left lateral flexion 50 Full no pain  Right rotation 25, pain Full no pain  Left rotation 25, pain Full no pain   (Blank rows = not tested)  CERVICAL ROM:  AROM Eval (% available) 11/15/22 deg  Flexion 75, no pain 40 deg pain into shoulders  Extension 50, pain in neck 30 pain into shoulders  Right lateral flexion 50, pain on Lt 25 Rt neck pain  Left lateral flexion 70, pain on Rt 25,  Rt neck pain  Right rotation 75 65 Rt neck pain  Left rotation 75 75   (Blank rows = not tested)  LOWER EXTREMITY ROM:     Decreased bil hip extension and IR  UPPER EXTREMITY ROM: Rt:  Flexion 145 pain Ext: 50 Abd: 105 A/ROM, 140 P/ROM  Lt:  Flexion: 140 pain Ext: 65 no pain Abd: 135 A/ROM  Bil functional IR reaches L3  UPPER EXTREMITY MMT: 5/5 bil shoulders and elbows   LOWER EXTREMITY MMT:    MMT Right eval Left eval Right 6/12 Left 6/12  Hip flexion 4 4 5 5   Hip extension 3 3 5 5   Hip abduction 4 4 5 5   Hip adduction 5 5    Hip internal rotation 5 5    Hip external rotation 5 5     (Blank rows = not tested)  FUNCTIONAL TESTS:  -Chin up test: 2 finger width diastasis with distortion   SPECIAL TESTS 6/12: + impingement tests Rt shoulder with horiz adduction  GAIT: 11/15/22: Eval: Comments: Rt antalgic gait pattern  TODAY'S TREATMENT:                                                                                                                              DATE:  11/20/22: UBE L2 2x2 fwd/bwd Doorway pec stretch Levator scap stretch 2x20 with overpressure Upper trap stretch 2x20" with overpressure Cervical extension x10 with towel self-mobs at C/T junction SNAG bil cervical rotation 3x10" SL open book 5x bil Supine lower trunk rotation x5 to each side Supine hooklying shoulder ROM using dowel +5lb ankle weight chest press to overhead  for shoulder flexion end range dynamic stretch x 10 25lb kbell deadlift with upright row x20 25lb kbell farmer hold arm at side bil x 30" keeping trunk level Fwd walks pulling 5lb cable pulley with shoulder in ext to simulate rolling suitcase x10 each side Lifting simulation of suitcase transfer to car trunk onto mat table using BOSU - PT cued 1/4 turn footwork to avoid lift/twist, cued using deeper squat and bring load close to chest x 10 reps Chair massage deep pressure and stripping with TP manual release to Lt upper  trap, Lt levator, tack and stretch with Pt performing A/ROM for bil rotation  11/15/22: Updated meaurements - added UE MMT and ROM and special tests A/ROM neck rotation and SB x 10 bil Cervical SB stretch with overpressure 2x20 bil Levator scap stretch 2x20 with overpressure 2x20 bil Cervical rotation SNAG with towel 2x20" bil Standing row blue tband 2x10 Standing bil shoulder ER blue band 2x10 Pec doorway stretch 1x20"  Bil UE wall slide x10  11/08/22: NuStep L5 x 5' PT present to discuss status Quadruped cat/cow and child's pose - pain Supine bil shoulder AA/ROM flexion ROM/stretch x 10 - relief in shoulders Vertical on foam roller bil UE Y/W/T x 5 rounds Supine TA indraw with alt LE march to 90/90 Seated trunk flexion ball rollouts 3-ways x 6 rounds Supine cervical spine STM and P/ROM stretching Prone lumbar STM bil    PATIENT EDUCATION:  Education details: See above Person educated: Patient Education method: Explanation, Demonstration, Tactile cues, Verbal cues, and Handouts Education comprehension: verbalized understanding  HOME EXERCISE PROGRAM: Access Code: [email or web address] URL: [email or web address] Date: 11/15/2022 Prepared by: Loistine Simas Dinesh Ulysse  Exercises - Supine Lower Trunk Rotation  - 1 x daily - 7 x weekly - 3 sets - 10 reps - Seated Piriformis Stretch with Trunk Bend  - 1 x daily - 7 x weekly - 1 sets - 1 reps - 60 seconds hold - Seated Shoulder Rolls  - 1 x daily - 7 x weekly - 3 sets - 10 reps - Seated Cervical Rotation AROM  - 1 x daily - 7 x weekly - 1 sets - 10 reps - Seated Cervical Sidebending AROM  - 1 x daily - 7 x weekly - 1 sets - 10 reps - Seated Levator Scapulae Stretch  - 1 x daily - 7 x weekly - 1 sets - 2 reps - 20 sec hold - Seated Cervical Sidebending Stretch  - 1 x daily - 7 x weekly - 1 sets - 2 reps - 20 sec hold - Seated Assisted Cervical Rotation with Towel  - 1 x daily - 7 x weekly - 1 sets - 2 reps - 20 sec hold - Standing Row  with Anchored Resistance  - 1 x daily - 7 x weekly - 2 sets - 10 reps - Shoulder Flexion Wall Slide with Towel  - 1 x daily - 7 x weekly - 1 sets - 10 reps - Doorway Pec Stretch at 90 Degrees Abduction  - 1 x daily - 7 x weekly - 1 sets - 2 reps - 20 sec hold  ASSESSMENT:  CLINICAL IMPRESSION: Pt reports occassional short lasting spasms in lumbar spine.  He continues to report signif pain in bil shoulders and limited and painful neck ROM.  He is anxious to begin working toward return to work training.  PT worked with him today on simulated rolling of a suitcase with UE pulling with weighted cable and lifting and load  techniques avoiding bend/lift/twist using proper body mechanics and footwork today.  He was able to demo proper dead lift with 25lb today without pain and we used bulky BOSU for suitcase simulation.  Pt reported improved lumbar stiffness end of session.   He continued to have Lt upper quadrant pain so PT performed deep tissue work and TP release to Lt upper trap/levator which alleviated pain and allowed for improved bil cervical ROM into Rot.  Pt will benefit from further progression of functional body mechanics and functional strength training for return to work as mobility improves.   OBJECTIVE IMPAIRMENTS: Abnormal gait, decreased activity tolerance, decreased coordination, decreased endurance, decreased mobility, decreased strength, increased muscle spasms, impaired flexibility, impaired tone, improper body mechanics, postural dysfunction, and pain.   ACTIVITY LIMITATIONS: carrying, lifting, bending, standing, transfers, bed mobility, and locomotion level  PARTICIPATION LIMITATIONS: community activity and occupation  PERSONAL FACTORS: 1 comorbidity: medical history  are also affecting patient's functional outcome.   REHAB POTENTIAL: Good  CLINICAL DECISION MAKING: Stable/uncomplicated  EVALUATION COMPLEXITY: Low   GOALS: Goals reviewed with patient? Yes  SHORT TERM GOALS:  Target date: 11/20/22  Pt will be independent with HEP.   Baseline: Goal status: met 6/5  2.  Pt will report no pain greater than 6/10. Baseline:  Goal status: met for lumbar spine 6/12, ongoing for shoulders 6/12 still 8/10   LONG TERM GOALS: Target date: 12/18/2022  Pt will be independent with advanced HEP.   Baseline:  Goal status: ongoing  2.  Pt will report no pain greater than 3/10. Baseline:  Goal status: ongoing  3.  Pt will increase all impaired lumbar A/ROM by 25% without pain.  Baseline:  Goal status: met for lumbar spine 6/12  4.  Pt will be able to lift 25 lbs with appropriate body mechanics and no pain in order to return to work without difficulty.  Baseline:  Goal status: INITIAL  5.  Pt will demonstrate increase in all impaired hip strength by 1 muscle grades in order to demonstrate improved lumbopelvic support and increase functional ability.   Baseline:  Goal status: met 6/12  PLAN:  PT FREQUENCY: 2x/week  PT DURATION: 8 weeks  PLANNED INTERVENTIONS: Therapeutic exercises, Therapeutic activity, Neuromuscular re-education, Balance training, Gait training, Patient/Family education, Self Care, Joint mobilization, Dry Needling, and Manual therapy.  PLAN FOR NEXT SESSION: manual therapy from chair to cervical spine using deep tissue pressure, update measures for cervical ROM, cervical spine ROM and stretching, functional body mechanics and strength for return to work simulation, f/u on pain level recovery after last session, Nustep, LE flexibility, core strengthening, possibly add shoulder stabilization.  Functional job training for lifting, bending, carrying, squatting.  (No DN - he does not like needles).    Morton Peters, PT 11/20/22 1:43 PM      [facility] [address] [place], [place] [address] Phone # [phone] Fax [phone]

## 2022-11-23 ENCOUNTER — Encounter: Payer: Self-pay | Admitting: Physical Therapy

## 2022-11-23 ENCOUNTER — Ambulatory Visit: Payer: No Typology Code available for payment source | Admitting: Physical Therapy

## 2022-11-23 DIAGNOSIS — M5459 Other low back pain: Secondary | ICD-10-CM

## 2022-11-23 DIAGNOSIS — R293 Abnormal posture: Secondary | ICD-10-CM

## 2022-11-23 DIAGNOSIS — M6281 Muscle weakness (generalized): Secondary | ICD-10-CM

## 2022-11-23 DIAGNOSIS — M62838 Other muscle spasm: Secondary | ICD-10-CM

## 2022-11-23 DIAGNOSIS — M542 Cervicalgia: Secondary | ICD-10-CM

## 2022-11-23 NOTE — Therapy (Signed)
OUTPATIENT PHYSICAL THERAPY THORACOLUMBAR TREATMENT NOTE   Patient Name: Tony Russell. MRN: [id number] DOB:05-04-1941, 82 y.o., male Today's Date: 11/23/2022  END OF SESSION:  PT End of Session - 11/23/22 1106     Visit Number 8    Date for PT Re-Evaluation 12/18/22    Authorization Type [employer]    PT Start Time 1102    PT Stop Time 1144    PT Time Calculation (min) 42 min    Activity Tolerance Patient tolerated treatment well    Behavior During Therapy WFL for tasks assessed/performed                 Past Medical History:  Diagnosis Date   Diabetes mellitus without complication (HCC)    History reviewed. No pertinent surgical history. Patient Active Problem List   Diagnosis Date Noted   Acute bilateral low back pain without sciatica 10/25/2022   Pulmonary nodule 10/25/2022   HTN (hypertension) 10/20/2022   Type 2 diabetes mellitus with hyperglycemia (HCC) 06/14/2019   Acute respiratory failure with hypoxia and hypercapnia (HCC) 06/14/2019   Pneumonia due to COVID-19 virus 06/10/2019    PCP: Karie Georges, MD   REFERRING PROVIDER: Karie Georges, MD  REFERRING DIAG: M54.50 (ICD-10-CM) - Acute bilateral low back pain without sciatica  Rationale for Evaluation and Treatment: Rehabilitation  THERAPY DIAG:  Other low back pain  Cervicalgia  Abnormal posture  Muscle weakness (generalized)  Other muscle spasm  ONSET DATE: 10/10/22  SUBJECTIVE:                                                                                                                                                                                           SUBJECTIVE STATEMENT: My pain is coming down.  The massage really helped my neck last time.  Still some pain but not as bad.  PERTINENT HISTORY:  HTN, acute respiratory failure, DM    PAIN:   Are you having pain?  SHOULDERS: Yes: NRPS 6/10 bil shoulders LUMBAR: Yes: NPRS scale: 4/10 Pain location: low  back and bil shoulders, thoracic spine  Pain description: aching  Aggravating factors: strenuous activities, prolonged walking, standing (<5 minutes) Relieving factors: tylenol, getting comfortable in a chair  PRECAUTIONS: None  WEIGHT BEARING RESTRICTIONS: No  FALLS:  Has patient fallen in last 6 months? 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  PLOF: Independent  PATIENT GOALS: decrease pain  NEXT MD VISIT: 3 months  OBJECTIVE:  10/23/22: DIAGNOSTIC FINDINGS:  X-rays with no significant findings  PATIENT SURVEYS:   FOTO 47  SCREENING FOR RED FLAGS: Bowel or bladder incontinence: No Spinal tumors: No Cauda equina syndrome: No Compression fracture: No Abdominal aneurysm: No  COGNITION: Overall cognitive status: Within functional limits for tasks assessed     SENSATION: WFL  MUSCLE LENGTH: Decreased bil hamstring length Decreased bil hip flexor length  POSTURE: 11/15/22: improved postural awarness, increased lumbar lordosis  Eval:  rounded shoulders, forward head, decreased lumbar lordosis, and increased thoracic kyphosis  PALPATION: Tightness in bil upper traps, suboccipitals, cervical paraspinals, thoracic paraspinals, lumbar paraspinals  LUMBAR ROM:   AROM Eval (% available) 11/15/22 % available  Flexion 90%, pain in low back Full, fingers to toes end range stretch no pain  Extension 20, mild pain in low back 25 deg no pain  Right lateral flexion 50 Full no pain  Left lateral flexion 50 Full no pain  Right rotation 25, pain Full no pain  Left rotation 25, pain Full no pain   (Blank rows = not tested)  CERVICAL ROM:  AROM Eval (% available) 11/15/22 deg  Flexion 75, no pain 40 deg pain into shoulders  Extension 50, pain in neck 30 pain into shoulders  Right lateral flexion 50, pain on Lt 25 Rt neck pain  Left lateral flexion 70, pain on Rt  25, Rt neck pain  Right rotation 75 65 Rt neck pain  Left rotation 75 75   (Blank rows = not tested)  LOWER EXTREMITY ROM:     Decreased bil hip extension and IR  UPPER EXTREMITY ROM: Rt:  Flexion 145 pain Ext: 50 Abd: 105 A/ROM, 140 P/ROM  Lt:  Flexion: 140 pain Ext: 65 no pain Abd: 135 A/ROM  Bil functional IR reaches L3  UPPER EXTREMITY MMT: 5/5 bil shoulders and elbows   LOWER EXTREMITY MMT:    MMT Right eval Left eval Right 6/12 Left 6/12  Hip flexion 4 4 5 5   Hip extension 3 3 5 5   Hip abduction 4 4 5 5   Hip adduction 5 5    Hip internal rotation 5 5    Hip external rotation 5 5     (Blank rows = not tested)  FUNCTIONAL TESTS:  -Chin up test: 2 finger width diastasis with distortion   SPECIAL TESTS 6/12: + impingement tests Rt shoulder with horiz adduction  GAIT: 11/15/22: Eval: Comments: Rt antalgic gait pattern  TODAY'S TREATMENT:                                                                                                                              DATE:  11/23/22: NuStep L5 x 5' PT present to discuss status Low cable squat to stand with bil UE row 1x10 15lb, 1x10 25lb Fwd walks pulling 5lb cable pulley with shoulder in ext to simulate rolling suitcase x10 each side Lifting simulation of suitcase transfer to car trunk onto mat table using BOSU - PT cued 1/4 turn footwork to  avoid lift/twist, cued using deeper squat and bring load close to chest x 10 reps 25lb kbell single arm farmer carry 1x30' each UE hold Deep squat to lift x5 10lb box, x5 20lb box with cue to keep box close to chest throughout squat to stand and stand to squat Chair massage deep pressure and stripping with TP manual release to bil upper trap, bil levator, tack and stretch with Pt performing A/ROM for bil rotation  11/20/22: UBE L2 2x2 fwd/bwd Doorway pec stretch Levator scap stretch 2x20 with overpressure Upper trap stretch 2x20" with overpressure Cervical extension x10  with towel self-mobs at C/T junction SNAG bil cervical rotation 3x10" SL open book 5x bil Supine lower trunk rotation x5 to each side Supine hooklying shoulder ROM using dowel +5lb ankle weight chest press to overhead for shoulder flexion end range dynamic stretch x 10 25lb kbell deadlift with upright row x20 25lb kbell farmer hold arm at side bil x 30" keeping trunk level Fwd walks pulling 5lb cable pulley with shoulder in ext to simulate rolling suitcase x10 each side Lifting simulation of suitcase transfer to car trunk onto mat table using BOSU - PT cued 1/4 turn footwork to avoid lift/twist, cued using deeper squat and bring load close to chest x 10 reps Chair massage deep pressure and stripping with TP manual release to Lt upper trap, Lt levator, tack and stretch with Pt performing A/ROM for bil rotation  11/15/22: Updated meaurements - added UE MMT and ROM and special tests A/ROM neck rotation and SB x 10 bil Cervical SB stretch with overpressure 2x20 bil Levator scap stretch 2x20 with overpressure 2x20 bil Cervical rotation SNAG with towel 2x20" bil Standing row blue tband 2x10 Standing bil shoulder ER blue band 2x10 Pec doorway stretch 1x20"  Bil UE wall slide x10  11/08/22: NuStep L5 x 5' PT present to discuss status Quadruped cat/cow and child's pose - pain Supine bil shoulder AA/ROM flexion ROM/stretch x 10 - relief in shoulders Vertical on foam roller bil UE Y/W/T x 5 rounds Supine TA indraw with alt LE march to 90/90 Seated trunk flexion ball rollouts 3-ways x 6 rounds Supine cervical spine STM and P/ROM stretching Prone lumbar STM bil    PATIENT EDUCATION:  Education details: See above Person educated: Patient Education method: Explanation, Demonstration, Tactile cues, Verbal cues, and Handouts Education comprehension: verbalized understanding  HOME EXERCISE PROGRAM: Access Code: [email or web address] URL: [email or web address] Date: 11/15/2022 Prepared by:  Loistine Simas Kanden Carey  Exercises - Supine Lower Trunk Rotation  - 1 x daily - 7 x weekly - 3 sets - 10 reps - Seated Piriformis Stretch with Trunk Bend  - 1 x daily - 7 x weekly - 1 sets - 1 reps - 60 seconds hold - Seated Shoulder Rolls  - 1 x daily - 7 x weekly - 3 sets - 10 reps - Seated Cervical Rotation AROM  - 1 x daily - 7 x weekly - 1 sets - 10 reps - Seated Cervical Sidebending AROM  - 1 x daily - 7 x weekly - 1 sets - 10 reps - Seated Levator Scapulae Stretch  - 1 x daily - 7 x weekly - 1 sets - 2 reps - 20 sec hold - Seated Cervical Sidebending Stretch  - 1 x daily - 7 x weekly - 1 sets - 2 reps - 20 sec hold - Seated Assisted Cervical Rotation with Towel  - 1 x daily - 7 x weekly - 1 sets - 2 reps -  20 sec hold - Standing Row with Anchored Resistance  - 1 x daily - 7 x weekly - 2 sets - 10 reps - Shoulder Flexion Wall Slide with Towel  - 1 x daily - 7 x weekly - 1 sets - 10 reps - Doorway Pec Stretch at 90 Degrees Abduction  - 1 x daily - 7 x weekly - 1 sets - 2 reps - 20 sec hold  ASSESSMENT:  CLINICAL IMPRESSION: Pt reports reduction in pain levels in lumbar and upper quadrants.  Combination of STM to address tightness and trigger points and job specific training with body mechanics cueing are helping Pt.  PT notes trigger points in Lt upper quadrant are reduced in size today demo'ing good carry over from last visit.  PT continued to work with Pt on simulated rolling of a suitcase with UE pulling with weighted cable and lifting and load techniques avoiding bend/lift/twist using proper body mechanics and footwork today.  He was able to demo proper squat and lift with 25lb today without pain and we used bulky BOSU for suitcase simulation.  Pt will benefit from further progression of functional body mechanics and functional strength training for return to work as mobility improves.   OBJECTIVE IMPAIRMENTS: Abnormal gait, decreased activity tolerance, decreased coordination, decreased  endurance, decreased mobility, decreased strength, increased muscle spasms, impaired flexibility, impaired tone, improper body mechanics, postural dysfunction, and pain.   ACTIVITY LIMITATIONS: carrying, lifting, bending, standing, transfers, bed mobility, and locomotion level  PARTICIPATION LIMITATIONS: community activity and occupation  PERSONAL FACTORS: 1 comorbidity: medical history  are also affecting patient's functional outcome.   REHAB POTENTIAL: Good  CLINICAL DECISION MAKING: Stable/uncomplicated  EVALUATION COMPLEXITY: Low   GOALS: Goals reviewed with patient? Yes  SHORT TERM GOALS: Target date: 11/20/22  Pt will be independent with HEP.   Baseline: Goal status: met 6/5  2.  Pt will report no pain greater than 6/10. Baseline:  Goal status: met for lumbar spine 6/12, ongoing for shoulders 6/12 still 8/10   LONG TERM GOALS: Target date: 12/18/2022  Pt will be independent with advanced HEP.   Baseline:  Goal status: ongoing  2.  Pt will report no pain greater than 3/10. Baseline:  Goal status: ongoing  3.  Pt will increase all impaired lumbar A/ROM by 25% without pain.  Baseline:  Goal status: met for lumbar spine 6/12  4.  Pt will be able to lift 25 lbs with appropriate body mechanics and no pain in order to return to work without difficulty.  Baseline:  Goal status: INITIAL  5.  Pt will demonstrate increase in all impaired hip strength by 1 muscle grades in order to demonstrate improved lumbopelvic support and increase functional ability.   Baseline:  Goal status: met 6/12  PLAN:  PT FREQUENCY: 2x/week  PT DURATION: 8 weeks  PLANNED INTERVENTIONS: Therapeutic exercises, Therapeutic activity, Neuromuscular re-education, Balance training, Gait training, Patient/Family education, Self Care, Joint mobilization, Dry Needling, and Manual therapy.  PLAN FOR NEXT SESSION: manual therapy from chair to cervical spine using deep tissue pressure, update  measures for cervical ROM, cervical spine ROM and stretching, functional body mechanics and strength for return to work simulation, f/u on pain level recovery after last session, Nustep, LE flexibility, core strengthening, possibly add shoulder stabilization.  Functional job training for lifting, bending, carrying, squatting.  (No DN - he does not like needles).    Loistine Simas Cairo Agostinelli, PT 11/23/22 11:45 AM       [facility]  [place], [place], [place] [address] Phone # [phone] Fax [phone]

## 2022-11-27 ENCOUNTER — Ambulatory Visit: Payer: No Typology Code available for payment source | Admitting: Physical Therapy

## 2022-11-27 ENCOUNTER — Encounter: Payer: Self-pay | Admitting: Physical Therapy

## 2022-11-27 DIAGNOSIS — M542 Cervicalgia: Secondary | ICD-10-CM

## 2022-11-27 DIAGNOSIS — M6281 Muscle weakness (generalized): Secondary | ICD-10-CM

## 2022-11-27 DIAGNOSIS — R293 Abnormal posture: Secondary | ICD-10-CM

## 2022-11-27 DIAGNOSIS — M5459 Other low back pain: Secondary | ICD-10-CM
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
## 2022-11-28 ENCOUNTER — Encounter: Payer: Self-pay | Admitting: Family Medicine

## 2022-11-28 ENCOUNTER — Ambulatory Visit (INDEPENDENT_AMBULATORY_CARE_PROVIDER_SITE_OTHER): Payer: No Typology Code available for payment source | Admitting: Family Medicine

## 2022-11-28 VITALS — BP 150/82 | HR 69 | Temp 98.2°F | Ht 68.0 in | Wt 247.3 lb

## 2022-11-28 DIAGNOSIS — I10 Essential (primary) hypertension: Secondary | ICD-10-CM

## 2022-11-28 MED ORDER — TELMISARTAN 40 MG PO TABS: 40.0000 mg | ORAL_TABLET | Freq: Every day | ORAL | 1 refills | Status: DC
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
## 2022-11-28 NOTE — Assessment & Plan Note (Signed)
BP remains elevated, pt is agreeable to increasing the telmisartan to 40 mg daily, I have called in a new script. He will see me again in August for follow up. I think it would be ok for him to return to work. He has an upcoming appointment with the eye doctor in August also.

## 2022-11-28 NOTE — Patient Instructions (Signed)
Increase telmisartan to 40 mg daily. Ok to take 2 tablets of the 20 mg dose until you can get the 40 mg script filled.

## 2022-11-29 ENCOUNTER — Ambulatory Visit: Payer: No Typology Code available for payment source | Admitting: Rehabilitative and Restorative Service Providers"

## 2022-11-29 ENCOUNTER — Encounter: Payer: Self-pay | Admitting: Rehabilitative and Restorative Service Providers"

## 2022-11-29 DIAGNOSIS — R293 Abnormal posture: Secondary | ICD-10-CM

## 2022-11-29 DIAGNOSIS — M6281 Muscle weakness (generalized): Secondary | ICD-10-CM

## 2022-11-29 DIAGNOSIS — M5459 Other low back pain: Secondary | ICD-10-CM | POA: Diagnosis not present

## 2022-11-29 DIAGNOSIS — M62838 Other muscle spasm: Secondary | ICD-10-CM

## 2022-11-29 DIAGNOSIS — M542 Cervicalgia: Secondary | ICD-10-CM

## 2022-11-29 NOTE — Therapy (Signed)
OUTPATIENT PHYSICAL THERAPY TREATMENT NOTE   Patient Name: Tony Russell. MRN: [id number] DOB:[birth year], 82 y.o., male Today's Date: 11/29/2022   Progress Note Reporting Period 10/23/2022 to 11/29/2022  See note below for Objective Data and Assessment of Progress/Goals.       END OF SESSION:  PT End of Session - 11/29/22 1149     Visit Number 10    Date for PT Re-Evaluation 12/18/22    Authorization Type [employer]    Progress Note Due on Visit 20    PT Start Time 1147    PT Stop Time 1225    PT Time Calculation (min) 38 min    Activity Tolerance Patient tolerated treatment well    Behavior During Therapy WFL for tasks assessed/performed                  Past Medical History:  Diagnosis Date   Diabetes mellitus without complication (HCC)    History reviewed. No pertinent surgical history. Patient Active Problem List   Diagnosis Date Noted   Acute bilateral low back pain without sciatica 10/25/2022   Pulmonary nodule 10/25/2022   HTN (hypertension) 10/20/2022   Type 2 diabetes mellitus with hyperglycemia (HCC) 06/14/2019   Acute respiratory failure with hypoxia and hypercapnia (HCC) 06/14/2019   Pneumonia due to COVID-19 virus 06/10/2019    PCP: Karie Georges, MD   REFERRING PROVIDER: Karie Georges, MD  REFERRING DIAG: M54.50 (ICD-10-CM) - Acute bilateral low back pain without sciatica  Rationale for Evaluation and Treatment: Rehabilitation  THERAPY DIAG:  Other low back pain  Abnormal posture  Cervicalgia  Muscle weakness (generalized)  Other muscle spasm  ONSET DATE: 10/10/22  SUBJECTIVE:                                                                                                                                                                                           SUBJECTIVE STATEMENT: Pt reports overall feeling better, but does report some increased scapular/thoracic pain today.  Reports pain levels same as  last visit.  PERTINENT HISTORY:  HTN, acute respiratory failure, DM    PAIN:   Are you having pain?  SHOULDERS: Yes: NRPS 6/10 bil shoulders LUMBAR: Yes: NPRS scale: 5/10 Pain location: low back and bil shoulders, thoracic spine  Pain description: aching  Aggravating factors: strenuous activities, prolonged walking, standing (<5 minutes) Relieving factors: tylenol, getting comfortable in a chair, staying moving  PRECAUTIONS: None  WEIGHT BEARING RESTRICTIONS: No  FALLS:  Has patient fallen in last 6 months? 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  PLOF: Independent  PATIENT GOALS: decrease pain  NEXT MD VISIT: 3 months  OBJECTIVE:  10/23/22: DIAGNOSTIC FINDINGS:  X-rays with no significant findings   PATIENT SURVEYS:   Eval:  FOTO 47 11/29/2022:  FOTO 54  SCREENING FOR RED FLAGS: Bowel or bladder incontinence: No Spinal tumors: No Cauda equina syndrome: No Compression fracture: No Abdominal aneurysm: No  COGNITION: Overall cognitive status: Within functional limits for tasks assessed     SENSATION: WFL  MUSCLE LENGTH: Decreased bil hamstring length Decreased bil hip flexor length  POSTURE: 11/15/22: improved postural awarness, increased lumbar lordosis  Eval:  rounded shoulders, forward head, decreased lumbar lordosis, and increased thoracic kyphosis  PALPATION: Tightness in bil upper traps, suboccipitals, cervical paraspinals, thoracic paraspinals, lumbar paraspinals  LUMBAR ROM:   AROM Eval (% available) 11/15/22 % available  Flexion 90%, pain in low back Full, fingers to toes end range stretch no pain  Extension 20, mild pain in low back 25 deg no pain  Right lateral flexion 50 Full no pain  Left lateral flexion 50 Full no pain  Right rotation 25, pain Full no pain  Left rotation 25, pain Full no pain   (Blank rows = not  tested)  CERVICAL ROM:  AROM Eval (% available) 11/15/22 deg  Flexion 75, no pain 40 deg pain into shoulders  Extension 50, pain in neck 30 pain into shoulders  Right lateral flexion 50, pain on Lt 25 Rt neck pain  Left lateral flexion 70, pain on Rt 25, Rt neck pain  Right rotation 75 65 Rt neck pain  Left rotation 75 75   (Blank rows = not tested)  LOWER EXTREMITY ROM:     Decreased bil hip extension and IR  UPPER EXTREMITY ROM: Rt:  Flexion 145 pain Ext: 50 Abd: 105 A/ROM, 140 P/ROM  Lt:  Flexion: 140 pain Ext: 65 no pain Abd: 135 A/ROM  Bil functional IR reaches L3  UPPER EXTREMITY MMT: 5/5 bil shoulders and elbows   LOWER EXTREMITY MMT:    MMT Right eval Left eval Right 6/12 Left 6/12  Hip flexion 4 4 5 5   Hip extension 3 3 5 5   Hip abduction 4 4 5 5   Hip adduction 5 5    Hip internal rotation 5 5    Hip external rotation 5 5     (Blank rows = not tested)  FUNCTIONAL TESTS:  -Chin up test: 2 finger width diastasis with distortion   SPECIAL TESTS 6/12: + impingement tests Rt shoulder with horiz adduction  GAIT: 11/15/22: Eval: Comments: Rt antalgic gait pattern  TODAY'S TREATMENT:                                                                                                                              DATE:  11/29/2022: NuStep L5 x 6' PT present to discuss status Seated shoulder pulleys flexion x 2', abduction x 2'  FOTO Standing facing wall lower trap lift-off 2x10 Seated thoracic extension with ball behind back 2x10 20lb kbell single arm farmer carry around ortho gym 1 lap each Standing 20lb kbell deadlift 2x10 (cuing for proper technique to decrease strain on back) Seated shoulder ER with red tband x10 then x20 Standing overhead shoulder pulldown/extension 2x10   11/27/22: NuStep L5 x 6' PT present to discuss status UBE L4 alt each min fwd/bwd x 4'  Seated shoulder pulleys flexion x 2', abduction x 2' 20lb kbell single arm farmer carry  around ortho gym 1 lap each Standing 20lb kbell deadlift x12, catch/pain on return from full deadlift, better with cue to perform 50-75% range and engage core more Seated cervical extension with towel for self-mob x 10 Seated cervical rotation SNAG 3X10" bil Seated cervical flexion with towel for overpressure 3x10" Squat lifts of BOSU from floor to treatment table with body mechanics and footwork to avoid loaded twisting x 5 reps Standing cable row bil 2x10 20lb   11/23/22: NuStep L5 x 5' PT present to discuss status Low cable squat to stand with bil UE row 1x10 15lb, 1x10 25lb Fwd walks pulling 5lb cable pulley with shoulder in ext to simulate rolling suitcase x10 each side Lifting simulation of suitcase transfer to car trunk onto mat table using BOSU - PT cued 1/4 turn footwork to avoid lift/twist, cued using deeper squat and bring load close to chest x 10 reps 25lb kbell single arm farmer carry 1x30' each UE hold Deep squat to lift x5 10lb box, x5 20lb box with cue to keep box close to chest throughout squat to stand and stand to squat Chair massage deep pressure and stripping with TP manual release to bil upper trap, bil levator, tack and stretch with Pt performing A/ROM for bil rotation     PATIENT EDUCATION:  Education details: See above Person educated: Patient Education method: Explanation, Demonstration, Tactile cues, Verbal cues, and Handouts Education comprehension: verbalized understanding  HOME EXERCISE PROGRAM: Access Code: [email or web address] URL: [email or web address] Date: 11/15/2022 Prepared by: Loistine Simas Beuhring  Exercises - Supine Lower Trunk Rotation  - 1 x daily - 7 x weekly - 3 sets - 10 reps - Seated Piriformis Stretch with Trunk Bend  - 1 x daily - 7 x weekly - 1 sets - 1 reps - 60 seconds hold - Seated Shoulder Rolls  - 1 x daily - 7 x weekly - 3 sets - 10 reps - Seated Cervical Rotation AROM  - 1 x daily - 7 x weekly - 1 sets - 10 reps - Seated  Cervical Sidebending AROM  - 1 x daily - 7 x weekly - 1 sets - 10 reps - Seated Levator Scapulae Stretch  - 1 x daily - 7 x weekly - 1 sets - 2 reps - 20 sec hold - Seated Cervical Sidebending Stretch  - 1 x daily - 7 x weekly - 1 sets - 2 reps - 20 sec hold - Seated Assisted Cervical Rotation with Towel  - 1 x daily - 7 x weekly - 1 sets - 2 reps - 20 sec hold - Standing Row with Anchored Resistance  - 1 x daily - 7 x weekly - 2 sets - 10 reps - Shoulder Flexion Wall Slide with Towel  - 1 x daily - 7 x weekly - 1 sets - 10 reps - Doorway Pec Stretch at 90 Degrees Abduction  - 1 x daily - 7 x weekly - 1 sets - 2  reps - 20 sec hold  ASSESSMENT:  CLINICAL IMPRESSION: Pt presented to clinic with moderate pain in the lumbar spine in addition to bilateral scapular pain. Patient has had an improvement noted in his FOTO score and continues to make progress towards long term goals.  Patient continued to demonstrate gains in muscle strength and coordination. Evident by increased distance walked during single arm farmer carry in addition to progressions in overhead should pull down resistance. Patient did continue to require tactile and verbal cuing during scapular retraction in addition to banded external rotation for reduced pace. Patient continues to demonstrate a need for improved activity tolerance as patient required several rest breaks during activities.   OBJECTIVE IMPAIRMENTS: Abnormal gait, decreased activity tolerance, decreased coordination, decreased endurance, decreased mobility, decreased strength, increased muscle spasms, impaired flexibility, impaired tone, improper body mechanics, postural dysfunction, and pain.   ACTIVITY LIMITATIONS: carrying, lifting, bending, standing, transfers, bed mobility, and locomotion level  PARTICIPATION LIMITATIONS: community activity and occupation  PERSONAL FACTORS: 1 comorbidity: medical history  are also affecting patient's functional outcome.   REHAB  POTENTIAL: Good  CLINICAL DECISION MAKING: Stable/uncomplicated  EVALUATION COMPLEXITY: Low   GOALS: Goals reviewed with patient? Yes  SHORT TERM GOALS: Target date: 11/20/22  Pt will be independent with HEP.   Baseline: Goal status: met 6/5  2.  Pt will report no pain greater than 6/10. Baseline:  Goal status: met for lumbar spine 6/12, ongoing for shoulders 6/12 still 8/10   LONG TERM GOALS: Target date: 12/18/2022  Pt will be independent with advanced HEP.   Baseline:  Goal status: ongoing  2.  Pt will report no pain greater than 3/10. Baseline:  Goal status: ongoing  3.  Pt will increase all impaired lumbar A/ROM by 25% without pain.  Baseline:  Goal status: met for lumbar spine 6/12  4.  Pt will be able to lift 25 lbs with appropriate body mechanics and no pain in order to return to work without difficulty.  Baseline:  Goal status: met 6/24   5.  Pt will demonstrate increase in all impaired hip strength by 1 muscle grades in order to demonstrate improved lumbopelvic support and increase functional ability.   Baseline:  Goal status: met 6/12  PLAN:  PT FREQUENCY: 2x/week  PT DURATION: 8 weeks  PLANNED INTERVENTIONS: Therapeutic exercises, Therapeutic activity, Neuromuscular re-education, Balance training, Gait training, Patient/Family education, Self Care, Joint mobilization, Dry Needling, and Manual therapy.  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  Functional job training for lifting, bending, carrying, squatting.  (No DN - he does not like needles).    Clydie Braun Yvetta Drotar, PT 11/29/22 12:44 PM  Meghan Crowfoot, SPT present throughout  visit   [facility] [address], [address] [place], [place] [address] Phone # [phone] Fax [phone]

## 2022-12-04 ENCOUNTER — Ambulatory Visit: Payer: No Typology Code available for payment source | Attending: Family Medicine | Admitting: Physical Therapy

## 2022-12-04 ENCOUNTER — Encounter: Payer: Self-pay | Admitting: Physical Therapy

## 2022-12-04 DIAGNOSIS — M62838 Other muscle spasm: Secondary | ICD-10-CM | POA: Insufficient documentation

## 2022-12-04 DIAGNOSIS — R293 Abnormal posture: Secondary | ICD-10-CM | POA: Insufficient documentation

## 2022-12-04 DIAGNOSIS — M5459 Other low back pain: Secondary | ICD-10-CM | POA: Diagnosis present

## 2022-12-04 DIAGNOSIS — M6281 Muscle weakness (generalized): Secondary | ICD-10-CM | POA: Insufficient documentation

## 2022-12-04 DIAGNOSIS — M542 Cervicalgia: Secondary | ICD-10-CM | POA: Diagnosis present

## 2022-12-04 NOTE — Therapy (Signed)
OUTPATIENT PHYSICAL THERAPY TREATMENT NOTE   Patient Name: Tony Russell. MRN: [id number] DOB:May 17, 1941, 82 y.o., male Today's Date: 12/04/2022    END OF SESSION:  PT End of Session - 12/04/22 1148     Visit Number 11    Date for PT Re-Evaluation 12/18/22    Authorization Type [employer]    Progress Note Due on Visit 20    PT Start Time 1148    PT Stop Time 1230    PT Time Calculation (min) 42 min    Activity Tolerance Patient tolerated treatment well    Behavior During Therapy WFL for tasks assessed/performed                   Past Medical History:  Diagnosis Date   Diabetes mellitus without complication (HCC)    History reviewed. No pertinent surgical history. Patient Active Problem List   Diagnosis Date Noted   Acute bilateral low back pain without sciatica 10/25/2022   Pulmonary nodule 10/25/2022   HTN (hypertension) 10/20/2022   Type 2 diabetes mellitus with hyperglycemia (HCC) 06/14/2019   Acute respiratory failure with hypoxia and hypercapnia (HCC) 06/14/2019   Pneumonia due to COVID-19 virus 06/10/2019    PCP: Karie Georges, MD   REFERRING PROVIDER: Karie Georges, MD  REFERRING DIAG: M54.50 (ICD-10-CM) - Acute bilateral low back pain without sciatica  Rationale for Evaluation and Treatment: Rehabilitation  THERAPY DIAG:  Other low back pain  Abnormal posture  Cervicalgia  Muscle weakness (generalized)  Other muscle spasm  ONSET DATE: 10/10/22  SUBJECTIVE:                                                                                                                                                                                           SUBJECTIVE STATEMENT: My back is much better.  When I wake up in the morning it is very stiff but Tylenol and moving around really helps.   My neck hasn't been too much of a problem.  Looking to the Rt can be stiff and painful into Rt shoulder.   The shoulders still give me some  trouble but I do know they are getting better.  I think the exercises will continue to help.  I feel pressure in the shoulder joints themselves.   I plan to try going back to work on 12/11/22.  PERTINENT HISTORY:  HTN, acute respiratory failure, DM    PAIN:   Are you having pain?  SHOULDERS: Yes: NRPS 7-8/10 bil shoulders LUMBAR: Yes: NPRS scale: 3/10 Pain location: low back and bil shoulders, thoracic spine  Pain description: aching  Aggravating  factors: strenuous activities, prolonged walking, standing (<5 minutes) Relieving factors: tylenol, getting comfortable in a chair, staying moving  PRECAUTIONS: None  WEIGHT BEARING RESTRICTIONS: No  FALLS:  Has patient fallen in last 6 months? 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  PLOF: Independent  PATIENT GOALS: decrease pain  NEXT MD VISIT: 3 months  OBJECTIVE:  10/23/22: DIAGNOSTIC FINDINGS:  X-rays with no significant findings   PATIENT SURVEYS:   Eval:  FOTO 47 11/29/2022:  FOTO 54 12/04/22: FOTO 72% met goal  SCREENING FOR RED FLAGS: Bowel or bladder incontinence: No Spinal tumors: No Cauda equina syndrome: No Compression fracture: No Abdominal aneurysm: No  COGNITION: Overall cognitive status: Within functional limits for tasks assessed     SENSATION: WFL  MUSCLE LENGTH: Decreased bil hamstring length Decreased bil hip flexor length  POSTURE: 11/15/22: improved postural awarness, increased lumbar lordosis  Eval:  rounded shoulders, forward head, decreased lumbar lordosis, and increased thoracic kyphosis  PALPATION: Tightness in bil upper traps, suboccipitals, cervical paraspinals, thoracic paraspinals, lumbar paraspinals  LUMBAR ROM:   AROM Eval (% available) 11/15/22 % available 7/1  Flexion 90%, pain in low back Full, fingers to toes end range stretch no pain Full fingers to toes no pain   Extension 20, mild pain in low back 25 deg no pain Full no pain  Right lateral flexion 50 Full no pain Full no pain  Left lateral flexion 50 Full no pain Full no pain  Right rotation 25, pain Full no pain Full no pain  Left rotation 25, pain Full no pain Full no pain   (Blank rows = not tested)  CERVICAL ROM:  AROM Eval (% available) 11/15/22 deg 7/1 deg  Flexion 75, no pain 40 deg pain into shoulders 45 central base of neck pain  Extension 50, pain in neck 30 pain into shoulders 55 central base of neck pain  Right lateral flexion 50, pain on Lt 25 Rt neck pain 35  Left lateral flexion 70, pain on Rt 25, Rt neck pain 35  Right rotation 75 65 Rt neck pain 60 Rt sided tightness  Left rotation 75 75 70 Rt sided tighness   (Blank rows = not tested)  LOWER EXTREMITY ROM:     Decreased bil hip extension and IR  UPPER EXTREMITY ROM: Note: 7/1 ROM measurements non-painful but tightness reported 7/1: Rt shoulder Flexion 147 Ext 60 Abd 160 7/1 Lt shoulder  Flexion 155 Ext: 57 abdL 155  Eval: Rt:  Flexion 145 pain Ext: 50 Abd: 105 A/ROM, 140 P/ROM  Lt:  Flexion: 140 pain Ext: 65 no pain Abd: 135 A/ROM  Bil functional IR reaches L3  UPPER EXTREMITY MMT: 5/5 bil shoulders and elbows   LOWER EXTREMITY MMT:    MMT Right eval Left eval Right 6/12 Left 6/12  Hip flexion 4 4 5 5   Hip extension 3 3 5 5   Hip abduction 4 4 5 5   Hip adduction 5 5    Hip internal rotation 5 5    Hip external rotation 5 5     (Blank rows = not tested)  FUNCTIONAL TESTS:  -Chin up test: 2 finger width diastasis with distortion   SPECIAL TESTS 6/12: + impingement tests Rt shoulder with horiz adduction  GAIT: 11/15/22: Eval: Comments: Rt antalgic gait pattern  TODAY'S TREATMENT:  DATE:  12/04/22: NuStep L5 x 6' PT present to discuss status Pt plans to return  to work 12/11/22 PT gave info on sports medicine MD practice and recommends an evaluation just to be sure there isn't any more management that would help Pt with remaining pain and promote successful return to work Towel assisted stretching all planes of motion standing 2x10" each direction Shoulder overhead stretching wall slides x5 each (some shoulder pain at top of range) Pec stretch in doorway x20" arms at 90 deg Objective measures taken and review of HEP   11/29/2022: NuStep L5 x 6' PT present to discuss status Seated shoulder pulleys flexion x 2', abduction x 2' FOTO Standing facing wall lower trap lift-off 2x10 Seated thoracic extension with ball behind back 2x10 20lb kbell single arm farmer carry around ortho gym 1 lap each Standing 20lb kbell deadlift 2x10 (cuing for proper technique to decrease strain on back) Seated shoulder ER with red tband x10 then x20 Standing overhead shoulder pulldown/extension 2x10   11/27/22: NuStep L5 x 6' PT present to discuss status UBE L4 alt each min fwd/bwd x 4'  Seated shoulder pulleys flexion x 2', abduction x 2' 20lb kbell single arm farmer carry around ortho gym 1 lap each Standing 20lb kbell deadlift x12, catch/pain on return from full deadlift, better with cue to perform 50-75% range and engage core more Seated cervical extension with towel for self-mob x 10 Seated cervical rotation SNAG 3X10" bil Seated cervical flexion with towel for overpressure 3x10" Squat lifts of BOSU from floor to treatment table with body mechanics and footwork to avoid loaded twisting x 5 reps Standing cable row bil 2x10 20lb  PATIENT EDUCATION:  Education details: See above Person educated: Patient Education method: Explanation, Demonstration, Tactile cues, Verbal cues, and Handouts Education comprehension: verbalized understanding  HOME EXERCISE PROGRAM: Access Code: [email or web address] URL: [email or web address] Date: 11/15/2022 Prepared by: Loistine Simas  Gissele Narducci  Exercises - Supine Lower Trunk Rotation  - 1 x daily - 7 x weekly - 3 sets - 10 reps - Seated Piriformis Stretch with Trunk Bend  - 1 x daily - 7 x weekly - 1 sets - 1 reps - 60 seconds hold - Seated Shoulder Rolls  - 1 x daily - 7 x weekly - 3 sets - 10 reps - Seated Cervical Rotation AROM  - 1 x daily - 7 x weekly - 1 sets - 10 reps - Seated Cervical Sidebending AROM  - 1 x daily - 7 x weekly - 1 sets - 10 reps - Seated Levator Scapulae Stretch  - 1 x daily - 7 x weekly - 1 sets - 2 reps - 20 sec hold - Seated Cervical Sidebending Stretch  - 1 x daily - 7 x weekly - 1 sets - 2 reps - 20 sec hold - Seated Assisted Cervical Rotation with Towel  - 1 x daily - 7 x weekly - 1 sets - 2 reps - 20 sec hold - Standing Row with Anchored Resistance  - 1 x daily - 7 x weekly - 2 sets - 10 reps - Shoulder Flexion Wall Slide with Towel  - 1 x daily - 7 x weekly - 1 sets - 10 reps - Doorway Pec Stretch at 90 Degrees Abduction  - 1 x daily - 7 x weekly - 1 sets - 2 reps - 20 sec hold  ASSESSMENT:  CLINICAL IMPRESSION: Pt reports much improved back and neck pain since starting PT.  Despite AM stiffness in lumbar  spine on waking, he states that moving around, stretching and taking Tylenol resolve this. He has been able to demo proper body mechanics and job simulation for squat, lift, transfer and carry loads up to 25lb.   Pt demos ongoing stiffness with some pain in neck and bil shoulders Rt>Lt.  Neck ROM continues to be limited and painful at end of available ranges and pain can radiate into Rt shoulder with Rt neck rotation.  He has limited but non-painful bil shoulder ROM, but available range is WFL bil.  He continues to have mild Rt shoulder impingement signs. Pt has HEP to manage remaining deficits.  PT gave information for sports medicine MD to assess neck and shoulders to give him any further input for managing remaining symptoms and guidance on return to work as needed.  Pt plans to attempt  going back to work on 12/11/22.  We will discharge him to HEP at this time.   OBJECTIVE IMPAIRMENTS: Abnormal gait, decreased activity tolerance, decreased coordination, decreased endurance, decreased mobility, decreased strength, increased muscle spasms, impaired flexibility, impaired tone, improper body mechanics, postural dysfunction, and pain.   ACTIVITY LIMITATIONS: carrying, lifting, bending, standing, transfers, bed mobility, and locomotion level  PARTICIPATION LIMITATIONS: community activity and occupation  PERSONAL FACTORS: 1 comorbidity: medical history  are also affecting patient's functional outcome.   REHAB POTENTIAL: Good  CLINICAL DECISION MAKING: Stable/uncomplicated  EVALUATION COMPLEXITY: Low   GOALS: Goals reviewed with patient? Yes  SHORT TERM GOALS: Target date: 11/20/22  Pt will be independent with HEP.   Baseline: Goal status: met 6/5  2.  Pt will report no pain greater than 6/10. Baseline:  Goal status: met for lumbar spine 6/12, ongoing for shoulders 6/12 still 8/10   LONG TERM GOALS: Target date: 12/18/2022  Pt will be independent with advanced HEP.   Baseline:  Goal status: MET 7/1  2.  Pt will report no pain greater than 3/10. Baseline:  Goal status: MET FOR LUMBAR AND NECK, NOT FOR SHOULDERS 7/1  3.  Pt will increase all impaired lumbar A/ROM by 25% without pain.  Baseline:  Goal status: met for lumbar spine 6/12  4.  Pt will be able to lift 25 lbs with appropriate body mechanics and no pain in order to return to work without difficulty.  Baseline:  Goal status: met 6/24   5.  Pt will demonstrate increase in all impaired hip strength by 1 muscle grades in order to demonstrate improved lumbopelvic support and increase functional ability.   Baseline:  Goal status: met 6/12  PLAN:  PT FREQUENCY: 2x/week  PT DURATION: 8 weeks  PLANNED INTERVENTIONS: Therapeutic exercises, Therapeutic activity, Neuromuscular re-education, Balance  training, Gait training, Patient/Family education, Self Care, Joint mobilization, Dry Needling, and Manual therapy.  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  Functional job training for lifting, bending, carrying, squatting.  (No DN - he does not like needles).    PHYSICAL THERAPY DISCHARGE SUMMARY  Visits from Start of Care: 11  Current functional level related to goals / functional outcomes: See above   Remaining deficits: See above   Education / Equipment: HEP   Patient agrees to discharge. Patient goals were partially met. Patient is being discharged due to maximized rehab potential.  Morton Peters, PT 12/04/22 1:34 PM     [facility] [address] [place], [place] [address] Phone # [phone] Fax [phone]

## 2022-12-15 ENCOUNTER — Encounter: Payer: Self-pay | Admitting: Family Medicine

## 2022-12-15 ENCOUNTER — Ambulatory Visit: Payer: No Typology Code available for payment source | Admitting: Family Medicine

## 2022-12-15 VITALS — BP 132/82 | HR 87 | Temp 98.2°F | Ht 68.0 in | Wt 240.7 lb

## 2022-12-15 DIAGNOSIS — E1165 Type 2 diabetes mellitus with hyperglycemia: Secondary | ICD-10-CM | POA: Diagnosis not present

## 2022-12-15 DIAGNOSIS — I10 Essential (primary) hypertension: Secondary | ICD-10-CM | POA: Diagnosis not present

## 2022-12-15 LAB — BASIC METABOLIC PANEL
BUN: 14 mg/dL (ref 6–23)
CO2: 27 mEq/L (ref 19–32)
Calcium: 9.5 mg/dL (ref 8.4–10.5)
Chloride: 100 mEq/L (ref 96–112)
Creatinine, Ser: 1.23 mg/dL (ref 0.40–1.50)
GFR: 55.82 mL/min — ABNORMAL LOW (ref >60.00)
Glucose, Bld: 300 mg/dL — ABNORMAL HIGH (ref 70–99)
Potassium: 4.9 mEq/L (ref 3.5–5.1)
Sodium: 132 mEq/L — ABNORMAL LOW (ref 135–145)

## 2022-12-15 LAB — LIPID PANEL
Cholesterol: 211 mg/dL — ABNORMAL HIGH (ref 0–200)
HDL: 31.5 mg/dL — ABNORMAL LOW (ref >39.00)
NonHDL: 179.55
Total CHOL/HDL Ratio: 7
Triglycerides: 296 mg/dL — ABNORMAL HIGH (ref 0.0–149.0)
VLDL: 59.2 mg/dL — ABNORMAL HIGH (ref 0.0–40.0)

## 2022-12-15 LAB — LDL CHOLESTEROL, DIRECT: Direct LDL: 145 mg/dL

## 2022-12-15 NOTE — Progress Notes (Signed)
   Established Patient Office Visit  Subjective   Patient ID: Tony Hamor., male    DOB: 1940-10-23  Age: 82 y.o. MRN: [id number]  Chief Complaint  Patient presents with   Medical Management of Chronic Issues    Pt here for follow up on blood pressure, we had increased his telmisartan to 40 mg daily. No side effects from the medication, no chest pain or SOB. He did have blood work done previously but has not had the  microalbumin done. Pt also needs a new lipid panel for risk factor modification. BP performed in office today and is 132/82, I rechecked this and it was the same number.     Current Outpatient Medications  Medication Instructions   telmisartan (MICARDIS) 40 mg, Oral, Daily    Patient Active Problem List   Diagnosis Date Noted   Acute bilateral low back pain without sciatica 10/25/2022   Pulmonary nodule 10/25/2022   HTN (hypertension) 10/20/2022   Type 2 diabetes mellitus with hyperglycemia (HCC) 06/14/2019   Acute respiratory failure with hypoxia and hypercapnia (HCC) 06/14/2019   Pneumonia due to COVID-19 virus 06/10/2019      Review of Systems  All other systems reviewed and are negative.     Objective:     BP 132/82 (BP Location: Left Arm, Patient Position: Sitting, Cuff Size: Large)   Pulse 87   Temp 98.2 F (36.8 C) (Oral)   Ht 5\' 8"  (1.727 m)   Wt 240 lb 11.2 oz (109.2 kg)   SpO2 97%   BMI 36.60 kg/m    Physical Exam Vitals reviewed.  Constitutional:      Appearance: Normal appearance. He is obese.  Eyes:     Conjunctiva/sclera: Conjunctivae normal.  Pulmonary:     Effort: Pulmonary effort is normal.  Musculoskeletal:        General: Normal range of motion.  Neurological:     Mental Status: He is alert and oriented to person, place, and time. Mental status is at baseline.  Psychiatric:        Mood and Affect: Mood normal.        Behavior: Behavior normal.      No results found for any visits on 12/15/22.    The ASCVD  Risk score (Arnett DK, et al., 2019) failed to calculate for the following reasons:   Cannot find a previous HDL lab   Cannot find a previous total cholesterol lab    Assessment & Plan:  Type 2 diabetes mellitus with hyperglycemia, without long-term current use of insulin (HCC) Assessment & Plan: Pt needs repeat blood work today, microalbumin level, we will return next month for his A1C check (too early to check it today). He had declined medication previously to treat his blood sugars.   Orders: -     Basic metabolic panel -     Microalbumin / creatinine urine ratio -     Lipid panel  Primary hypertension Assessment & Plan: BP is now controlled on the 40 mg telmisartan once daily, he is doing well on this medication, will continue as prescribed.       No follow-ups on file.    Karie Georges, MD

## 2022-12-15 NOTE — Assessment & Plan Note (Signed)
BP is now controlled on the 40 mg telmisartan once daily, he is doing well on this medication, will continue as prescribed.

## 2022-12-15 NOTE — Assessment & Plan Note (Signed)
Pt needs repeat blood work today, microalbumin level, we will return next month for his A1C check (too early to check it today). He had declined medication previously to treat his blood sugars.

## 2022-12-18 ENCOUNTER — Encounter: Payer: Self-pay | Admitting: Family Medicine

## 2022-12-18 ENCOUNTER — Ambulatory Visit (INDEPENDENT_AMBULATORY_CARE_PROVIDER_SITE_OTHER): Payer: No Typology Code available for payment source

## 2022-12-18 ENCOUNTER — Ambulatory Visit (INDEPENDENT_AMBULATORY_CARE_PROVIDER_SITE_OTHER): Payer: No Typology Code available for payment source | Admitting: Family Medicine

## 2022-12-18 VITALS — BP 130/80 | HR 81 | Ht 68.0 in | Wt 246.8 lb

## 2022-12-18 DIAGNOSIS — M47812 Spondylosis without myelopathy or radiculopathy, cervical region: Secondary | ICD-10-CM | POA: Diagnosis not present

## 2022-12-18 DIAGNOSIS — G8929 Other chronic pain: Secondary | ICD-10-CM

## 2022-12-18 DIAGNOSIS — E1165 Type 2 diabetes mellitus with hyperglycemia: Secondary | ICD-10-CM | POA: Diagnosis not present

## 2022-12-18 DIAGNOSIS — M545 Low back pain, unspecified: Secondary | ICD-10-CM

## 2022-12-18 DIAGNOSIS — M47816 Spondylosis without myelopathy or radiculopathy, lumbar region: Secondary | ICD-10-CM | POA: Diagnosis not present

## 2022-12-18 DIAGNOSIS — M47814 Spondylosis without myelopathy or radiculopathy, thoracic region: Secondary | ICD-10-CM | POA: Diagnosis not present

## 2022-12-18 DIAGNOSIS — M549 Dorsalgia, unspecified: Secondary | ICD-10-CM | POA: Diagnosis not present

## 2022-12-18 NOTE — Patient Instructions (Signed)
Thank you for coming in today.   Please get an Xray today before you leave   Continue home exercises.
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
## 2022-12-19 ENCOUNTER — Telehealth: Payer: Self-pay | Admitting: Family Medicine

## 2022-12-19 MED ORDER — ATORVASTATIN CALCIUM 40 MG PO TABS: 40.0000 mg | ORAL_TABLET | Freq: Every day | ORAL | 1 refills | Status: DC

## 2022-12-19 MED ORDER — METFORMIN HCL 500 MG PO TABS: 500.0000 mg | ORAL_TABLET | Freq: Two times a day (BID) | ORAL | 1 refills | Status: DC

## 2022-12-19 NOTE — Addendum Note (Signed)
Addended by: Karie Georges on: 12/19/2022 03:03 PM   Modules accepted: Orders

## 2022-12-19 NOTE — Progress Notes (Signed)
Sent in the scripts.

## 2022-12-19 NOTE — Telephone Encounter (Signed)
Pt called to say he is taking the telmisartan (MICARDIS) 40 MG tablet and does not think he needs "the other one".  Pt was not sure of the name.

## 2022-12-20 NOTE — Telephone Encounter (Signed)
Pt called and he will check his VM

## 2022-12-20 NOTE — Telephone Encounter (Signed)
Left a detailed message with the information below at the patient's cell number and requested he call back with more information.

## 2022-12-20 NOTE — Telephone Encounter (Signed)
I had called in atorvastatin and metformin for 30 days  yesterday-- he may be getting confused about what the medications were for... would you be able to call him to clarify? If he still refuses the prescriptions then we can cancel them

## 2022-12-22 NOTE — Telephone Encounter (Signed)
Pt is calling back and said he will take medications

## 2022-12-25 NOTE — Telephone Encounter (Signed)
Noted  

## 2022-12-25 NOTE — Telephone Encounter (Signed)
Ok great thanks! °

## 2022-12-25 NOTE — Progress Notes (Signed)
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

## 2022-12-25 NOTE — Progress Notes (Signed)
Lumbar spine x-ray shows mild arthritis.

## 2022-12-27 DIAGNOSIS — E139 Other specified diabetes mellitus without complications: Secondary | ICD-10-CM | POA: Diagnosis not present

## 2022-12-27 DIAGNOSIS — I10 Essential (primary) hypertension: Secondary | ICD-10-CM | POA: Diagnosis not present

## 2022-12-28 ENCOUNTER — Encounter: Payer: Self-pay | Admitting: Family Medicine

## 2022-12-28 ENCOUNTER — Ambulatory Visit: Payer: No Typology Code available for payment source | Admitting: Family Medicine

## 2022-12-28 ENCOUNTER — Telehealth: Payer: Self-pay | Admitting: Family Medicine

## 2022-12-28 VITALS — BP 150/90 | HR 90 | Temp 97.5°F | Ht 68.0 in | Wt 238.3 lb

## 2022-12-28 DIAGNOSIS — Z7984 Long term (current) use of oral hypoglycemic drugs: Secondary | ICD-10-CM

## 2022-12-28 DIAGNOSIS — T466X5A Adverse effect of antihyperlipidemic and antiarteriosclerotic drugs, initial encounter: Secondary | ICD-10-CM | POA: Diagnosis not present

## 2022-12-28 DIAGNOSIS — R200 Anesthesia of skin: Secondary | ICD-10-CM

## 2022-12-28 DIAGNOSIS — E1165 Type 2 diabetes mellitus with hyperglycemia: Secondary | ICD-10-CM

## 2022-12-28 DIAGNOSIS — G720 Drug-induced myopathy: Secondary | ICD-10-CM

## 2022-12-28 MED ORDER — DAPAGLIFLOZIN PROPANEDIOL 5 MG PO TABS: 5.0000 mg | ORAL_TABLET | Freq: Every day | ORAL | 1 refills | Status: DC

## 2022-12-28 MED ORDER — GLIPIZIDE 5 MG PO TABS: 5.0000 mg | ORAL_TABLET | Freq: Two times a day (BID) | ORAL | 3 refills | Status: DC

## 2022-12-28 NOTE — Telephone Encounter (Signed)
Pt is calling and can not afford dapagliflozin propanediol (FARXIGA) 5 MG TABS tablet 90 day supply will cost 500.00.Marland Kitchen Please advise

## 2022-12-28 NOTE — Telephone Encounter (Signed)
Script sent  

## 2022-12-28 NOTE — Progress Notes (Signed)
   Acute Office Visit  Subjective:     Patient ID: Tony Bail., male    DOB: [birth year], 82 y.o.   MRN: [id number]  Chief Complaint  Patient presents with   Numbness    Patient complains of numbness bilateral hands x3 days   Balance problems x4 days    HPI Patient is in today for hand/finger numbness. States that he took the atorvastatin and the metformin over the weekend. He took about 3-4 doses of each medication. States that he had soreness in his hands and he lost strength in his arms. He reports he stopped both medications yesterday.   Of note, BP is elevated today, he reports he didn't take his medication until noon today. He will be seeing me in August so we will recheck it then.  Review of Systems  All other systems reviewed and are negative.       Objective:    BP (!) 150/90 (BP Location: Right Arm, Patient Position: Sitting, Cuff Size: Large)   Pulse 90   Temp (!) 97.5 F (36.4 C) (Oral)   Ht 5\' 8"  (1.727 m)   Wt 238 lb 4.8 oz (108.1 kg)   SpO2 95%   BMI 36.23 kg/m    Physical Exam Vitals reviewed.  Constitutional:      Appearance: Normal appearance. He is obese.  Pulmonary:     Effort: Pulmonary effort is normal.  Musculoskeletal:        General: No swelling, tenderness or deformity. Normal range of motion.  Neurological:     General: No focal deficit present.     Mental Status: He is alert and oriented to person, place, and time.  Psychiatric:        Mood and Affect: Mood normal.        Behavior: Behavior normal.     No results found for any visits on 12/28/22.      Assessment & Plan:   Problem List Items Addressed This Visit       Unprioritized   Type 2 diabetes mellitus with hyperglycemia (HCC)   Relevant Medications   dapagliflozin propanediol (FARXIGA) 5 MG TABS tablet   Statin myopathy - Primary (Chronic)  Patient is likely describing statin myopathy. I advised that the medication should be discontinued. We talked about  using a different medication for his diabetes, he is agreeable to trying farxiga, will start at 5 mg daily and increase as tolerated. I will be seeing him next month for a repeat A1C, will check his BP then.   Meds ordered this encounter  Medications   dapagliflozin propanediol (FARXIGA) 5 MG TABS tablet    Sig: Take 1 tablet (5 mg total) by mouth daily.    Dispense:  90 tablet    Refill:  1    No follow-ups on file.  Karie Georges, MD

## 2022-12-28 NOTE — Telephone Encounter (Signed)
Message sent to PCP as patient was informed of the message below and agreed to try Glipizide.

## 2022-12-28 NOTE — Telephone Encounter (Signed)
Will he agree to take Glipizide? Its much cheaper

## 2023-01-03 ENCOUNTER — Encounter (INDEPENDENT_AMBULATORY_CARE_PROVIDER_SITE_OTHER): Payer: Self-pay

## 2023-01-10 ENCOUNTER — Other Ambulatory Visit: Payer: Self-pay | Admitting: Family Medicine

## 2023-01-10 DIAGNOSIS — E1165 Type 2 diabetes mellitus with hyperglycemia: Secondary | ICD-10-CM

## 2023-01-11 DIAGNOSIS — Z961 Presence of intraocular lens: Secondary | ICD-10-CM | POA: Diagnosis not present

## 2023-01-11 DIAGNOSIS — E113293 Type 2 diabetes mellitus with mild nonproliferative diabetic retinopathy without macular edema, bilateral: Secondary | ICD-10-CM | POA: Diagnosis not present

## 2023-01-11 DIAGNOSIS — Z7984 Long term (current) use of oral hypoglycemic drugs: Secondary | ICD-10-CM | POA: Diagnosis not present

## 2023-01-11 LAB — HM DIABETES EYE EXAM

## 2023-01-22 ENCOUNTER — Encounter: Payer: Self-pay | Admitting: Family Medicine

## 2023-01-22 ENCOUNTER — Ambulatory Visit: Payer: No Typology Code available for payment source | Admitting: Family Medicine

## 2023-01-22 VITALS — BP 120/60 | HR 77 | Temp 97.7°F | Ht 68.0 in | Wt 246.9 lb

## 2023-01-22 DIAGNOSIS — E1165 Type 2 diabetes mellitus with hyperglycemia: Secondary | ICD-10-CM | POA: Diagnosis not present

## 2023-01-22 LAB — POCT GLYCOSYLATED HEMOGLOBIN (HGB A1C): Hemoglobin A1C: 10.5 % — AB (ref 4.0–5.6)

## 2023-01-22 NOTE — Assessment & Plan Note (Signed)
A1C is better today at 10.9, still not at goal, I advised the patient that we need to get his A1C less than 9 to help reduce the risk of organ damage, pt states he will think about increasing his glpizide, will see him back in 3 months.

## 2023-01-22 NOTE — Progress Notes (Signed)
Established Patient Office Visit  Subjective   Patient ID: Tony Russell., male    DOB: Feb 07, 1941  Age: 82 y.o. MRN: [id number]  Chief Complaint  Patient presents with   Medical Management of Chronic Issues    Pt is here to follow up on his uncontrolled diabetes. He has been taking his glipizide 5 mg twice a day. Could not afford the farxgia so this was removed from his medication list. States that he is compliant with the telmisartan for his blood pressure. He denies any side effects to the medications. A1C today is 10.9 which is better than previous. We discussed increasing his glipizide to 10 mg twice a day and pt states he will think about it. I encouraged him to do so because the medication is helping his blood sugars. Pt reports he did have his eye exam, I reviewed the note in the computer. Foot exam one today.     Current Outpatient Medications  Medication Instructions   glipiZIDE (GLUCOTROL) 5 MG tablet TAKE 1 TABLET (5 MG TOTAL) BY MOUTH TWICE A DAY BEFORE MEALS   telmisartan (MICARDIS) 40 mg, Oral, Daily    Patient Active Problem List   Diagnosis Date Noted   Statin myopathy 12/28/2022   Acute bilateral low back pain without sciatica 10/25/2022   Pulmonary nodule 10/25/2022   HTN (hypertension) 10/20/2022   Type 2 diabetes mellitus with hyperglycemia (HCC) 06/14/2019   Acute respiratory failure with hypoxia and hypercapnia (HCC) 06/14/2019   Pneumonia due to COVID-19 virus 06/10/2019      Review of Systems  All other systems reviewed and are negative.     Objective:     BP 120/60 (BP Location: Left Arm, Patient Position: Sitting, Cuff Size: Large)   Pulse 77   Temp 97.7 F (36.5 C) (Oral)   Ht 5\' 8"  (1.727 m)   Wt 246 lb 14.4 oz (112 kg)   SpO2 95%   BMI 37.54 kg/m    Physical Exam Vitals reviewed.  Constitutional:      Appearance: Normal appearance. He is obese.  Cardiovascular:     Rate and Rhythm: Normal rate and regular rhythm.      Heart sounds: Normal heart sounds. No murmur heard. Pulmonary:     Effort: Pulmonary effort is normal.     Breath sounds: Normal breath sounds.  Abdominal:     General: Bowel sounds are normal.  Neurological:     Mental Status: He is alert and oriented to person, place, and time. Mental status is at baseline.  Psychiatric:        Mood and Affect: Mood normal.        Behavior: Behavior normal.    Diabetic Foot Exam - Simple   Simple Foot Form Diabetic Foot exam was performed with the following findings: Yes 01/22/2023  9:17 AM  Visual Inspection No deformities, no ulcerations, no other skin breakdown bilaterally: Yes Sensation Testing Intact to touch and monofilament testing bilaterally: Yes Pulse Check Posterior Tibialis and Dorsalis pulse intact bilaterally: Yes Comments       Results for orders placed or performed in visit on 01/22/23  POC HgB A1c  Result Value Ref Range   Hemoglobin A1C 10.5 (A) 4.0 - 5.6 %   HbA1c POC (<> result, manual entry)     HbA1c, POC (prediabetic range)     HbA1c, POC (controlled diabetic range)        The 10-year ASCVD risk score (Arnett DK, et al., 2019) is:  42.9%    Assessment & Plan:  Type 2 diabetes mellitus with hyperglycemia, without long-term current use of insulin (HCC) Assessment & Plan: A1C is better today at 10.9, still not at goal, I advised the patient that we need to get his A1C less than 9 to help reduce the risk of organ damage, pt states he will think about increasing his glpizide, will see him back in 3 months.   Orders: -     POCT glycosylated hemoglobin (Hb A1C)     Return in about 3 months (around 04/24/2023).    Karie Georges, MD

## 2023-01-24 ENCOUNTER — Encounter: Payer: Self-pay | Admitting: Ophthalmology

## 2023-02-03 DIAGNOSIS — I10 Essential (primary) hypertension: Secondary | ICD-10-CM | POA: Diagnosis not present

## 2023-02-03 DIAGNOSIS — E139 Other specified diabetes mellitus without complications: Secondary | ICD-10-CM | POA: Diagnosis not present

## 2023-02-27 ENCOUNTER — Other Ambulatory Visit: Payer: Self-pay | Admitting: Family Medicine

## 2023-02-27 DIAGNOSIS — I10 Essential (primary) hypertension: Secondary | ICD-10-CM

## 2023-03-05 DIAGNOSIS — E119 Type 2 diabetes mellitus without complications: Secondary | ICD-10-CM | POA: Diagnosis not present

## 2023-03-05 DIAGNOSIS — I10 Essential (primary) hypertension: Secondary | ICD-10-CM | POA: Diagnosis not present

## 2023-04-03 DIAGNOSIS — Z008 Encounter for other general examination: Secondary | ICD-10-CM | POA: Diagnosis not present

## 2023-04-03 DIAGNOSIS — I129 Hypertensive chronic kidney disease with stage 1 through stage 4 chronic kidney disease, or unspecified chronic kidney disease: Secondary | ICD-10-CM | POA: Diagnosis not present

## 2023-04-03 DIAGNOSIS — Z6838 Body mass index (BMI) 38.0-38.9, adult: Secondary | ICD-10-CM | POA: Diagnosis not present

## 2023-04-03 DIAGNOSIS — E785 Hyperlipidemia, unspecified: Secondary | ICD-10-CM | POA: Diagnosis not present

## 2023-04-03 DIAGNOSIS — N1831 Chronic kidney disease, stage 3a: Secondary | ICD-10-CM | POA: Diagnosis not present

## 2023-04-03 DIAGNOSIS — F17211 Nicotine dependence, cigarettes, in remission: Secondary | ICD-10-CM | POA: Diagnosis not present

## 2023-04-04 DIAGNOSIS — I10 Essential (primary) hypertension: Secondary | ICD-10-CM | POA: Diagnosis not present

## 2023-04-04 DIAGNOSIS — E119 Type 2 diabetes mellitus without complications: Secondary | ICD-10-CM | POA: Diagnosis not present

## 2023-04-05 DIAGNOSIS — I10 Essential (primary) hypertension: Secondary | ICD-10-CM | POA: Diagnosis not present

## 2023-04-05 DIAGNOSIS — E119 Type 2 diabetes mellitus without complications: Secondary | ICD-10-CM | POA: Diagnosis not present

## 2023-04-06 ENCOUNTER — Other Ambulatory Visit: Payer: Self-pay | Admitting: Family Medicine

## 2023-04-06 DIAGNOSIS — I10 Essential (primary) hypertension: Secondary | ICD-10-CM

## 2023-04-24 ENCOUNTER — Telehealth: Payer: Self-pay | Admitting: Family Medicine

## 2023-04-24 ENCOUNTER — Encounter: Payer: Self-pay | Admitting: Family Medicine

## 2023-04-24 ENCOUNTER — Ambulatory Visit (INDEPENDENT_AMBULATORY_CARE_PROVIDER_SITE_OTHER): Payer: No Typology Code available for payment source | Admitting: Family Medicine

## 2023-04-24 VITALS — BP 120/82 | HR 86 | Temp 97.4°F | Ht 68.0 in | Wt 251.2 lb

## 2023-04-24 DIAGNOSIS — I10 Essential (primary) hypertension: Secondary | ICD-10-CM

## 2023-04-24 DIAGNOSIS — R051 Acute cough: Secondary | ICD-10-CM

## 2023-04-24 DIAGNOSIS — Z7984 Long term (current) use of oral hypoglycemic drugs: Secondary | ICD-10-CM

## 2023-04-24 DIAGNOSIS — E1165 Type 2 diabetes mellitus with hyperglycemia: Secondary | ICD-10-CM | POA: Diagnosis not present

## 2023-04-24 LAB — POCT GLYCOSYLATED HEMOGLOBIN (HGB A1C): Hemoglobin A1C: 8.3 % — AB (ref 4.0–5.6)

## 2023-04-24 LAB — MICROALBUMIN / CREATININE URINE RATIO
Creatinine,U: 126.5 mg/dL
Microalb Creat Ratio: 16.5 mg/g (ref 0.0–30.0)
Microalb, Ur: 20.9 mg/dL — ABNORMAL HIGH (ref 0.0–1.9)

## 2023-04-24 MED ORDER — GLIPIZIDE 10 MG PO TABS: 10.0000 mg | ORAL_TABLET | Freq: Two times a day (BID) | ORAL | 3 refills | Status: DC

## 2023-04-24 MED ORDER — DM-GUAIFENESIN ER 30-600 MG PO TB12: 1.0000 | ORAL_TABLET | Freq: Two times a day (BID) | ORAL | 0 refills | Status: AC

## 2023-04-24 NOTE — Assessment & Plan Note (Signed)
A1C is much improved on the current dose of glipizide 10 mg BID. I advised adding a cholesterol medication to protect his heart but patient continues to decline the addition of other medications. RTC 6 months.

## 2023-04-24 NOTE — Assessment & Plan Note (Signed)
BP is now controlled on the 40 mg telmisartan once daily, he is doing well on this medication, will continue as prescribed.

## 2023-04-24 NOTE — Progress Notes (Signed)
Established Patient Office Visit  Subjective   Patient ID: Tony Russell., male    DOB: 07-28-1940  Age: 82 y.o. MRN: [id number]  Chief Complaint  Patient presents with   Medical Management of Chronic Issues   Cough    Productive with green sputum x1 week    DM --Pt is here for follow up on his diabetes. He reports he is taking 10 mg BID of the glipizide, he denies any side effects to the medication, no changes in bowel habits, no dry mouth, is urinating well. A1C performed in office today and is 8.3, pt states he is also trying to watch his diet.   HTN -- BP in office performed and is well controlled. He  reports no side effects to the medications, no chest pain, SOB, dizziness or headaches. He has a BP cuff at home and is checking BP regularly, reports they are in the normal range.    Current Outpatient Medications  Medication Instructions   glipiZIDE (GLUCOTROL) 10 mg, Oral, 2 times daily before meals   telmisartan (MICARDIS) 40 mg, Oral, Daily    Patient Active Problem List   Diagnosis Date Noted   Statin myopathy 12/28/2022   Acute bilateral low back pain without sciatica 10/25/2022   Pulmonary nodule 10/25/2022   HTN (hypertension) 10/20/2022   Type 2 diabetes mellitus with hyperglycemia (HCC) 06/14/2019   Acute respiratory failure with hypoxia and hypercapnia (HCC) 06/14/2019   Pneumonia due to COVID-19 virus 06/10/2019      Review of Systems  All other systems reviewed and are negative.     Objective:     BP 120/82 (BP Location: Left Arm, Patient Position: Sitting, Cuff Size: Large)   Pulse 86   Temp (!) 97.4 F (36.3 C) (Oral)   Ht 5\' 8"  (1.727 m)   Wt 251 lb 3.2 oz (113.9 kg)   SpO2 98%   BMI 38.19 kg/m    Physical Exam Vitals reviewed.  Constitutional:      Appearance: Normal appearance. He is obese.  HENT:     Nose: Nose normal.  Neck:     Thyroid: No thyromegaly.  Cardiovascular:     Rate and Rhythm: Normal rate and regular rhythm.      Pulses: Normal pulses.     Heart sounds: Normal heart sounds. No murmur heard. Pulmonary:     Effort: Pulmonary effort is normal.     Breath sounds: Normal breath sounds. No wheezing, rhonchi or rales.  Musculoskeletal:     Cervical back: Normal range of motion.     Right lower leg: No edema.     Left lower leg: No edema.  Neurological:     Mental Status: He is alert and oriented to person, place, and time. Mental status is at baseline.  Psychiatric:        Mood and Affect: Mood normal.        Behavior: Behavior normal.      Results for orders placed or performed in visit on 04/24/23  POC HgB A1c  Result Value Ref Range   Hemoglobin A1C 8.3 (A) 4.0 - 5.6 %   HbA1c POC (<> result, manual entry)     HbA1c, POC (prediabetic range)     HbA1c, POC (controlled diabetic range)        The 10-year ASCVD risk score (Arnett DK, et al., 2019) is: 42.9%    Assessment & Plan:  Type 2 diabetes mellitus with hyperglycemia, without long-term current use of  insulin (HCC) Assessment & Plan: A1C is much improved on the current dose of glipizide 10 mg BID. I advised adding a cholesterol medication to protect his heart but patient continues to decline the addition of other medications. RTC 6 months.   Orders: -     POCT glycosylated hemoglobin (Hb A1C) -     glipiZIDE; Take 1 tablet (10 mg total) by mouth 2 (two) times daily before a meal.  Dispense: 180 tablet; Refill: 3 -     Microalbumin / creatinine urine ratio  Primary hypertension Assessment & Plan: BP is now controlled on the 40 mg telmisartan once daily, he is doing well on this medication, will continue as prescribed.       Return in about 6 months (around 10/22/2023) for DM, HTN.    Karie Georges, MD

## 2023-04-24 NOTE — Telephone Encounter (Signed)
Pt has cough with mucus  and forgot to mention to md today and would like something for his cough sent to pharm  [id number] [place], [place] - [address] AT [place] [address] Phone: [phone]  Fax: [phone]

## 2023-04-24 NOTE — Telephone Encounter (Signed)
Script for mucinex DM sent

## 2023-04-24 NOTE — Telephone Encounter (Signed)
Patient informed of the message below.

## 2023-05-04 DIAGNOSIS — I10 Essential (primary) hypertension: Secondary | ICD-10-CM | POA: Diagnosis not present

## 2023-05-04 DIAGNOSIS — E119 Type 2 diabetes mellitus without complications: Secondary | ICD-10-CM | POA: Diagnosis not present

## 2023-05-05 DIAGNOSIS — I10 Essential (primary) hypertension: Secondary | ICD-10-CM | POA: Diagnosis not present

## 2023-05-05 DIAGNOSIS — E119 Type 2 diabetes mellitus without complications: Secondary | ICD-10-CM | POA: Diagnosis not present

## 2023-06-03 DIAGNOSIS — E119 Type 2 diabetes mellitus without complications: Secondary | ICD-10-CM | POA: Diagnosis not present

## 2023-06-03 DIAGNOSIS — I10 Essential (primary) hypertension: Secondary | ICD-10-CM | POA: Diagnosis not present

## 2023-06-05 DIAGNOSIS — I10 Essential (primary) hypertension: Secondary | ICD-10-CM | POA: Diagnosis not present

## 2023-06-07 DIAGNOSIS — I10 Essential (primary) hypertension: Secondary | ICD-10-CM | POA: Diagnosis not present

## 2023-07-03 DIAGNOSIS — I10 Essential (primary) hypertension: Secondary | ICD-10-CM | POA: Diagnosis not present

## 2023-07-06 DIAGNOSIS — I10 Essential (primary) hypertension: Secondary | ICD-10-CM | POA: Diagnosis not present

## 2023-07-30 ENCOUNTER — Other Ambulatory Visit: Payer: Self-pay | Admitting: Family Medicine

## 2023-07-30 DIAGNOSIS — I10 Essential (primary) hypertension: Secondary | ICD-10-CM

## 2023-08-02 DIAGNOSIS — I10 Essential (primary) hypertension: Secondary | ICD-10-CM | POA: Diagnosis not present

## 2023-09-01 DIAGNOSIS — I10 Essential (primary) hypertension: Secondary | ICD-10-CM | POA: Diagnosis not present

## 2023-09-03 DIAGNOSIS — I10 Essential (primary) hypertension: Secondary | ICD-10-CM | POA: Diagnosis not present

## 2023-09-14 ENCOUNTER — Other Ambulatory Visit: Payer: Self-pay | Admitting: Family Medicine

## 2023-09-14 DIAGNOSIS — I10 Essential (primary) hypertension: Secondary | ICD-10-CM

## 2023-09-16 NOTE — Telephone Encounter (Signed)
 Pt is on 40 mg daily -- please refuse the 20 mg and approve the 40 mg dose

## 2023-09-27 ENCOUNTER — Other Ambulatory Visit: Payer: Self-pay | Admitting: Family Medicine

## 2023-09-27 DIAGNOSIS — I10 Essential (primary) hypertension: Secondary | ICD-10-CM

## 2023-10-01 ENCOUNTER — Other Ambulatory Visit: Payer: Self-pay | Admitting: Family Medicine

## 2023-10-01 DIAGNOSIS — I10 Essential (primary) hypertension: Secondary | ICD-10-CM

## 2023-10-03 DIAGNOSIS — I10 Essential (primary) hypertension: Secondary | ICD-10-CM | POA: Diagnosis not present

## 2023-10-05 ENCOUNTER — Other Ambulatory Visit: Payer: Self-pay | Admitting: Family Medicine

## 2023-10-05 DIAGNOSIS — I10 Essential (primary) hypertension: Secondary | ICD-10-CM

## 2023-10-31 ENCOUNTER — Telehealth: Payer: Self-pay | Admitting: *Deleted

## 2023-10-31 DIAGNOSIS — I10 Essential (primary) hypertension: Secondary | ICD-10-CM | POA: Diagnosis not present

## 2023-10-31 NOTE — Telephone Encounter (Signed)
 Patient was identified as falling into the [place] Measure - Diabetes.   Patient was: Left voicemail to schedule with primary care provider.

## 2023-11-03 DIAGNOSIS — I10 Essential (primary) hypertension: Secondary | ICD-10-CM | POA: Diagnosis not present

## 2023-11-09 ENCOUNTER — Ambulatory Visit: Admitting: Family Medicine

## 2023-11-20 ENCOUNTER — Ambulatory Visit (INDEPENDENT_AMBULATORY_CARE_PROVIDER_SITE_OTHER): Admitting: Family Medicine

## 2023-11-20 ENCOUNTER — Encounter: Payer: Self-pay | Admitting: Family Medicine

## 2023-11-20 VITALS — BP 140/60 | HR 67 | Temp 98.5°F | Ht 68.0 in | Wt 254.7 lb

## 2023-11-20 DIAGNOSIS — E1165 Type 2 diabetes mellitus with hyperglycemia: Secondary | ICD-10-CM | POA: Diagnosis not present

## 2023-11-20 DIAGNOSIS — Z7984 Long term (current) use of oral hypoglycemic drugs: Secondary | ICD-10-CM

## 2023-11-20 DIAGNOSIS — I10 Essential (primary) hypertension: Secondary | ICD-10-CM | POA: Diagnosis not present

## 2023-11-20 LAB — POCT GLYCOSYLATED HEMOGLOBIN (HGB A1C): Hemoglobin A1C: 8.9 % — AB (ref 4.0–5.6)

## 2023-11-20 LAB — COMPREHENSIVE METABOLIC PANEL WITH GFR
ALT: 13 U/L (ref 0–53)
AST: 12 U/L (ref 0–37)
Albumin: 4 g/dL (ref 3.5–5.2)
Alkaline Phosphatase: 55 U/L (ref 39–117)
BUN: 15 mg/dL (ref 6–23)
CO2: 30 meq/L (ref 19–32)
Calcium: 8.8 mg/dL (ref 8.4–10.5)
Chloride: 100 meq/L (ref 96–112)
Creatinine, Ser: 1.2 mg/dL (ref 0.40–1.50)
GFR: 57.12 mL/min — ABNORMAL LOW (ref >60.00)
Glucose, Bld: 209 mg/dL — ABNORMAL HIGH (ref 70–99)
Potassium: 4.3 meq/L (ref 3.5–5.1)
Sodium: 134 meq/L — ABNORMAL LOW (ref 135–145)
Total Bilirubin: 0.4 mg/dL (ref 0.2–1.2)
Total Protein: 7.8 g/dL (ref 6.0–8.3)

## 2023-11-20 LAB — LIPID PANEL
Cholesterol: 207 mg/dL — ABNORMAL HIGH (ref 0–200)
HDL: 31 mg/dL — ABNORMAL LOW (ref >39.00)
LDL Cholesterol: 120 mg/dL — ABNORMAL HIGH (ref 0–99)
NonHDL: 176.43
Total CHOL/HDL Ratio: 7
Triglycerides: 280 mg/dL — ABNORMAL HIGH (ref 0.0–149.0)
VLDL: 56 mg/dL — ABNORMAL HIGH (ref 0.0–40.0)

## 2023-11-20 NOTE — Progress Notes (Signed)
 Established Patient Office Visit  Subjective   Patient ID: Tony Saber., male    DOB: Feb 25, 1941  Age: 83 y.o. MRN: [id number]  Chief Complaint  Patient presents with   Medical Management of Chronic Issues    Pt is here for follow up on his DM and HTN. He reports he tried to take the glipizide  twice a day however he started having pain in his legs and his right hand. States he could not make a fist with his right hand due to the pain, states that he went back to the once a day on the glipizide  and the pain/ swelling improved. A1C is performed in office and he is under 9, however he is not at goal. We discussed getting him enrolled in the patient assistance program for an SGLT2 medication and he is agreeable. Referral placed to pharmacist.   HTN-- pt reports he is checking his BP at home and he is getting readings in the 130's. States that he is tolerating his telmisartan  well, no side effects. 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     Objective:     BP (!) 140/60   Pulse 67   Temp 98.5 F (36.9 C) (Oral)   Ht 5' 8 (1.727 m)   Wt 254 lb 11.2 oz (115.5 kg)   SpO2 97%   BMI 38.73 kg/m    Physical  Exam Vitals reviewed.  Constitutional:      Appearance: Normal appearance. He is obese.   Cardiovascular:     Rate and Rhythm: Normal rate and regular rhythm.     Heart sounds: Normal heart sounds. No murmur heard. Pulmonary:     Effort: Pulmonary effort is normal.     Breath sounds: Normal breath sounds. No wheezing, rhonchi or rales.   Musculoskeletal:     Right lower leg: No edema.     Left lower leg: No edema.   Neurological:     Mental Status: He is alert and oriented to person, place, and time. Mental status is at baseline.   Psychiatric:        Mood and Affect: Mood normal.        Behavior: Behavior normal.       The ASCVD Risk score (Arnett DK, et al., 2019) failed to calculate for the following reasons:   The 2019 ASCVD risk score is only valid for ages 80 to 37    Assessment & Plan:  Type 2 diabetes mellitus with hyperglycemia, without long-term current use of insulin  (HCC) Assessment & Plan: A1c  today is 8.9, uncontrolled, pt had adv rxn to metformin  and statin, also reports adv rxn to the dose increase on the glipizide . Could not afford the  farxiga . Will place a referral to the pharmacist to enroll him in patient assistance program to help get the medication for him.   Orders: -     POCT glycosylated hemoglobin (Hb A1C) -     Collection capillary blood specimen -     AMB Referral VBCI Care Management -     Comprehensive metabolic panel with GFR; Future -     Lipid panel; Future -     Microalbumin / creatinine urine ratio; Future  Primary hypertension Assessment & Plan: Current hypertension medications:       Sig   telmisartan  (MICARDIS ) 40 MG tablet TAKE 1 TABLET BY MOUTH EVERY DAY      Chronic, stable, pt reports his BP is controlled at home,. Will recheck in 3 months at his next visit. Continue telmisartan  as prescribed       No follow-ups on file.    Aida House, MD

## 2023-11-21 ENCOUNTER — Telehealth: Payer: Self-pay | Admitting: *Deleted

## 2023-11-21 NOTE — Progress Notes (Signed)
 Care Guide Pharmacy Note  11/21/2023 Name: [name]. MRN: [id number] DOB: [birth year]  Referred By: Aida House, MD Reason for referral: Complex Care Management (Outreach to schedule referral with pharmacist ) and Call Attempt #1   Tony Russell Marieta Shorten. is a 83 y.o. year old male who is a primary care patient of Aida House, MD.  Tony Foy Mcroy Jr. was referred to the pharmacist for assistance related to: DMII  Successful contact was made with the patient to discuss pharmacy services including being ready for the pharmacist to call at least 5 minutes before the scheduled appointment time and to have medication bottles and any blood pressure readings ready for review. The patient agreed to meet with the pharmacist via telephone visit on 11/26/2023  Kandis Ormond, CMA [place]  [facility], [facility] Guide Direct Dial: [phone]  Fax: [phone] Website: [email or web address]

## 2023-11-21 NOTE — Assessment & Plan Note (Signed)
 Current hypertension medications:       Sig   telmisartan  (MICARDIS ) 40 MG tablet TAKE 1 TABLET BY MOUTH EVERY DAY      Chronic, stable, pt reports his BP is controlled at home,. Will recheck in 3 months at his next visit. Continue telmisartan  as prescribed

## 2023-11-21 NOTE — Progress Notes (Signed)
 Care Guide Pharmacy Note  11/21/2023 Name: Tony Russell. MRN: [id number] DOB: Aug 17, 1940  Referred By: Aida House, MD Reason for referral: Complex Care Management (Outreach to schedule referral with pharmacist ) and Call Attempt #1   Tony Russell Tony Russell. is a 83 y.o. year old male who is a primary care patient of Aida House, MD.  Tony Foy Hartgrove Jr. was referred to the pharmacist for assistance related to: DMII  An unsuccessful telephone outreach was attempted today to contact the patient who was referred to the pharmacy team for assistance with medication assistance. Additional attempts will be made to contact the patient.  Tony Russell, CMA [place]  [facility], [facility] Guide Direct Dial: [phone]  Fax: [phone] Website: [email or web address]

## 2023-11-21 NOTE — Assessment & Plan Note (Signed)
 A1c  today is 8.9, uncontrolled, pt had adv rxn to metformin  and statin, also reports adv rxn to the dose increase on the glipizide . Could not afford the farxiga . Will place a referral to the pharmacist to enroll him in patient assistance program to help get the medication for him.

## 2023-11-23 ENCOUNTER — Ambulatory Visit: Payer: Self-pay | Admitting: Family Medicine

## 2023-11-23 DIAGNOSIS — E782 Mixed hyperlipidemia: Secondary | ICD-10-CM

## 2023-11-26 ENCOUNTER — Other Ambulatory Visit (INDEPENDENT_AMBULATORY_CARE_PROVIDER_SITE_OTHER)

## 2023-11-26 ENCOUNTER — Telehealth: Payer: Self-pay | Admitting: *Deleted

## 2023-11-26 DIAGNOSIS — E782 Mixed hyperlipidemia: Secondary | ICD-10-CM

## 2023-11-26 DIAGNOSIS — E1165 Type 2 diabetes mellitus with hyperglycemia: Secondary | ICD-10-CM

## 2023-11-26 MED ORDER — ACCU-CHEK GUIDE ME W/DEVICE KIT: PACK | 0 refills | Status: DC

## 2023-11-26 MED ORDER — ATORVASTATIN CALCIUM 40 MG PO TABS: 40.0000 mg | ORAL_TABLET | Freq: Every day | ORAL | 1 refills | Status: DC

## 2023-11-26 MED ORDER — ACCU-CHEK GUIDE TEST VI STRP: ORAL_STRIP | 3 refills | Status: DC

## 2023-11-26 MED ORDER — ACCU-CHEK SOFTCLIX LANCETS MISC: 3 refills | Status: DC

## 2023-11-26 NOTE — Progress Notes (Signed)
 11/26/2023 Name: [place] Luppino Vos Jr. MRN: [id number] DOB: 07/19/1940  Chief Complaint  Patient presents with   Medication Management   Diabetes   Hyperlipidemia    Tony Mcfarren. is a 83 y.o. year old male who presented for a telephone visit.   They were referred to the pharmacist by their PCP for assistance in managing diabetes, medication access, and complex medication management.    Subjective:  Care Team: Primary Care Provider: Ozell Heron HERO, MD ; Next Scheduled Visit: 11/29/23  Medication Access/Adherence  Current Pharmacy:  [id number] [address], [place] - [address] AT [facility] [address]  [place] [address] Phone: [phone] Fax: [phone]   Patient reports affordability concerns with their medications: Not with his current medications, had issues with cost of newer DM therapies in the past Patient reports access/transportation concerns to their pharmacy: No  Patient reports adherence concerns with their medications:  Yes  - has self-adjusted meds in the past if he didn't feel like he was responding well   Diabetes:  Current medications: Glipizide  10mg  BID (taking 5mg  BID, did not feel well on 10mg  BID) Medications tried in the past: Metformin , Lantus   Current glucose readings: None - cannot recall last time he checked, says he has a meter but not sure it is working appropriately so stopped using, cannot recall name of meter and does not have it nearby at time of call   Observed patterns:  Patient denies hypoglycemic s/sx including dizziness, shakiness, sweating. Patient denies hyperglycemic symptoms including polyuria, polydipsia, polyphagia, nocturia, neuropathy, blurred vision.   Hyperlipidemia/ASCVD Risk Reduction  Current lipid lowering medications: Atorvastatin  40mg  (just ordered by PCP today, has not had a chance to pick up at time of this appt) Medications tried in the past: atorvastatin  in past  stopped due to myopathy but patient reports this lingered well after stopping the medication, open to a retrial at last PCP visit  Antiplatelet regimen: None   Objective:  Lab Results  Component Value Date   HGBA1C 8.9 (A) 11/20/2023    Lab Results  Component Value Date   CREATININE 1.20 11/20/2023   BUN 15 11/20/2023   NA 134 (L) 11/20/2023   K 4.3 11/20/2023   CL 100 11/20/2023   CO2 30 11/20/2023    Lab Results  Component Value Date   CHOL 207 (H) 11/20/2023   HDL 31.00 (L) 11/20/2023   LDLCALC 120 (H) 11/20/2023   LDLDIRECT 145.0 12/15/2022   TRIG 280.0 (H) 11/20/2023   CHOLHDL 7 11/20/2023    Medications Reviewed Today     Reviewed by Lionell Jon DEL, RPH (Pharmacist) on 11/26/23 at 1341  Med List Status: <None>   Medication Order Taking? Sig Documenting Provider Last Dose Status Informant  atorvastatin  (LIPITOR) 40 MG tablet [id number] Yes Take 1 tablet (40 mg total) by mouth daily. Ozell Heron HERO, MD  Active   dextromethorphan -guaiFENesin  (MUCINEX  DM) 30-600 MG 12hr tablet [id number]  Take 1 tablet by mouth 2 (two) times daily.  Patient not taking: Reported on 11/26/2023   Ozell Heron HERO, MD  Active   glipiZIDE  (GLUCOTROL ) 10 MG tablet [id number] Yes Take 1 tablet (10 mg total) by mouth 2 (two) times daily before a meal. Ozell Heron HERO, MD  Active   telmisartan  (MICARDIS ) 40 MG tablet [id number] Yes TAKE 1 TABLET BY MOUTH EVERY DAY Ozell Heron HERO, MD  Active  Assessment/Plan:   Diabetes: - Currently uncontrolled - Reviewed long term cardiovascular and renal outcomes of uncontrolled blood sugar - Reviewed goal A1c, goal fasting, and goal 2 hour post prandial glucose - Reviewed dietary modifications including low carb diet - Recommend to START Farxiga  pending PAP approval  - Patient denies personal or family history of multiple endocrine neoplasia type 2, medullary thyroid cancer; personal history of pancreatitis or  gallbladder disease. - Recommend to check glucose once daily at varying times - Meets financial criteria for Farxiga  patient assistance program through [employer]. Will collaborate with provider, patient coming by the office on 6/26 to sign his portion     Hyperlipidemia/ASCVD Risk Reduction: - Currently uncontrolled.  - Reviewed long term complications of uncontrolled cholesterol - Recommend to start atorvastatin  as prescribed by PCP     Follow Up Plan: 2 weeks  [name], PharmD Clinical Pharmacist [phone]

## 2023-11-26 NOTE — Telephone Encounter (Signed)
 Patient informed of the message below.

## 2023-11-26 NOTE — Telephone Encounter (Signed)
 Script for 40 mg atorvastatin  sent to his pharmacy

## 2023-11-26 NOTE — Telephone Encounter (Signed)
 Noted and script sent to his pharmacy

## 2023-11-26 NOTE — Telephone Encounter (Signed)
 See phone note

## 2023-11-26 NOTE — Telephone Encounter (Signed)
 Copied from CRM [id number]. Topic: Clinical - Lab/Test Results >> Nov 23, 2023  4:15 PM Sophia H wrote: Reason for CRM: Spoke with patient who is calling in regarding his recent my chart message from Dr. Ozell. The patient advised he does want to move forward with the cholesterol lowering medication that is being recommended. The patient also states he would prefer a phone call over my chart as it is hard for him to access and be able to communicate back and forth that way. Phone number for patient is # [phone]   [id number] [address], [place] - [address]

## 2023-11-29 ENCOUNTER — Telehealth: Payer: Self-pay | Admitting: *Deleted

## 2023-11-29 ENCOUNTER — Ambulatory Visit: Admitting: Family Medicine

## 2023-11-29 NOTE — Telephone Encounter (Signed)
 Patient was scheduled for an appt today at 11:30am with the reason for visit noting 'PCP sent a message about treatment recommended'.  PCP was concerned as to the need for a visit as he was previously seen.  Spoke with the patient after he checked in and he stated he was not aware as to why he was here.  It appears visit was made via Mychart by the patient.  Patient stated this was an error, denied any new concerns and the appt was cancelled.  Reminder card given for video visit with PCP on 7/30.

## 2023-11-30 DIAGNOSIS — I10 Essential (primary) hypertension: Secondary | ICD-10-CM | POA: Diagnosis not present

## 2023-12-03 DIAGNOSIS — I10 Essential (primary) hypertension: Secondary | ICD-10-CM | POA: Diagnosis not present

## 2023-12-12 ENCOUNTER — Other Ambulatory Visit

## 2023-12-12 DIAGNOSIS — E1165 Type 2 diabetes mellitus with hyperglycemia: Secondary | ICD-10-CM

## 2023-12-12 NOTE — Progress Notes (Signed)
 12/12/2023 Name: Tony Hammitt Sippel Jr. MRN: [id number] DOB: 1940/12/10  Chief Complaint  Patient presents with   Medication Management   Diabetes    Tony Callens. is a 83 y.o. year old male who presented for a telephone visit.   They were referred to the pharmacist by their PCP for assistance in managing diabetes, medication access, and complex medication management.    Subjective:  Care Team: Primary Care Provider: Ozell Heron HERO, MD ; Next Scheduled Visit: 11/29/23  Medication Access/Adherence  Current Pharmacy:  [id number] [address], [place] - [address] AT [facility] [address] [place] [address] Phone: [phone] Fax: [phone]   Patient reports affordability concerns with their medications: Not with his current medications, had issues with cost of newer DM therapies in the past Patient reports access/transportation concerns to their pharmacy: No  Patient reports adherence concerns with their medications:  Yes  - has self-adjusted meds in the past if he didn't feel like he was responding well   Diabetes:  Current medications: Glipizide  10mg  BID (taking 5mg  BID, did not feel well on 10mg  BID) Medications tried in the past: Metformin , Lantus   Current glucose readings: None - cannot recall last time he checked, says he has a meter but not sure it is working appropriately so stopped using, cannot recall name of meter and does not have it nearby at time of call   Observed patterns:  Patient denies hypoglycemic s/sx including dizziness, shakiness, sweating. Patient denies hyperglycemic symptoms including polyuria, polydipsia, polyphagia, nocturia, neuropathy, blurred vision.   Hyperlipidemia/ASCVD Risk Reduction  Current lipid lowering medications: Atorvastatin  40mg  (just ordered by PCP today, has not had a chance to pick up at time of this appt) Medications tried in the past: atorvastatin  in past stopped due to  myopathy but patient reports this lingered well after stopping the medication, open to a retrial at last PCP visit  Antiplatelet regimen: None   Objective:  Lab Results  Component Value Date   HGBA1C 8.9 (A) 11/20/2023    Lab Results  Component Value Date   CREATININE 1.20 11/20/2023   BUN 15 11/20/2023   NA 134 (L) 11/20/2023   K 4.3 11/20/2023   CL 100 11/20/2023   CO2 30 11/20/2023    Lab Results  Component Value Date   CHOL 207 (H) 11/20/2023   HDL 31.00 (L) 11/20/2023   LDLCALC 120 (H) 11/20/2023   LDLDIRECT 145.0 12/15/2022   TRIG 280.0 (H) 11/20/2023   CHOLHDL 7 11/20/2023    Medications Reviewed Today     Reviewed by Tony Tony DEL, RPH (Pharmacist) on 12/12/23 at 1334  Med List Status: <None>   Medication Order Taking? Sig Documenting Provider Last Dose Status Informant  Accu-Chek Softclix Lancets lancets [id number]  Use to check blood sugar once daily Dx: E11.65 Please substitute to any brand preferred by insurance if needed Tony Heron HERO, MD  Active   atorvastatin  (LIPITOR) 40 MG tablet [id number] Yes Take 1 tablet (40 mg total) by mouth daily. Tony Heron HERO, MD  Active   Blood Glucose Monitoring Suppl (ACCU-CHEK GUIDE ME) w/Device KIT [id number]  Use to check blood sugar once daily Dx: E11.65 Please substitute to any brand preferred by insurance if needed Tony Heron HERO, MD  Active   dextromethorphan -guaiFENesin  (MUCINEX  DM) 30-600 MG 12hr tablet [id number]  Take 1 tablet by mouth 2 (two) times daily.  Patient not taking: Reported on 11/26/2023   Tony Heron HERO,  MD  Active   glipiZIDE  (GLUCOTROL ) 10 MG tablet [id number]  Take 1 tablet (10 mg total) by mouth 2 (two) times daily before a meal.  Patient taking differently: Take 1 tablet (10 mg total) by mouth 2 (two) times daily before a meal.   Tony Heron HERO, MD  Active   glucose blood (ACCU-CHEK GUIDE TEST) test strip [id number]  Use to check blood sugar once daily Dx: E11.65 Please  substitute to any brand preferred by insurance if needed Tony Heron HERO, MD  Active   telmisartan  (MICARDIS ) 40 MG tablet [id number] Yes TAKE 1 TABLET BY MOUTH EVERY DAY Tony Heron HERO, MD  Active               Assessment/Plan:   Diabetes: - Currently uncontrolled - Reviewed long term cardiovascular and renal outcomes of uncontrolled blood sugar - Reviewed goal A1c, goal fasting, and goal 2 hour post prandial glucose - Reviewed dietary modifications including low carb diet - Recommend to START Farxiga  with samples, reminded patient to come by and sign his assistance paperwork - Patient denies personal or family history of multiple endocrine neoplasia type 2, medullary thyroid cancer; personal history of pancreatitis or gallbladder disease. - Recommend to check glucose once daily at varying times. Let us  know if need a new meter or supplies      Hyperlipidemia/ASCVD Risk Reduction: - Currently uncontrolled.  - Reviewed long term complications of uncontrolled cholesterol -Continue current medication therapy     Follow Up Plan: 1 month  Tony Russell, PharmD Clinical Pharmacist [phone]

## 2023-12-17 ENCOUNTER — Telehealth: Payer: Self-pay

## 2023-12-17 NOTE — Progress Notes (Signed)
   12/17/2023  Patient ID: Tony Russell., male   DOB: 01/24/1941, 84 y.o.   MRN: [id number]  Patient returned his portion of the Farxiga  10mg  patient assistance application through [employer].  Submitted application to company via fax today, pending company response.  [name], PharmD Clinical Pharmacist [phone]

## 2023-12-18 ENCOUNTER — Telehealth: Payer: Self-pay

## 2023-12-18 NOTE — Progress Notes (Signed)
   12/18/2023  Patient ID: Tony Russell., male   DOB: March 28, 1941, 83 y.o.   MRN: [id number]  Received notification from [employer] that patient has been approved to receive Farxiga  10mg  from the company through 06/04/24. First shipment pending and patient should receive within 7-10 business days.  [name], PharmD Clinical Pharmacist [phone]

## 2024-01-02 ENCOUNTER — Encounter: Payer: Self-pay | Admitting: Family Medicine

## 2024-01-02 ENCOUNTER — Ambulatory Visit: Admitting: Family Medicine

## 2024-01-02 VITALS — BP 144/90 | HR 75 | Temp 98.1°F | Ht 68.0 in | Wt 255.1 lb

## 2024-01-02 DIAGNOSIS — R2242 Localized swelling, mass and lump, left lower limb: Secondary | ICD-10-CM

## 2024-01-02 DIAGNOSIS — E1165 Type 2 diabetes mellitus with hyperglycemia: Secondary | ICD-10-CM

## 2024-01-02 DIAGNOSIS — I10 Essential (primary) hypertension: Secondary | ICD-10-CM

## 2024-01-02 DIAGNOSIS — Z7189 Other specified counseling: Secondary | ICD-10-CM | POA: Diagnosis not present

## 2024-01-02 DIAGNOSIS — Z0000 Encounter for general adult medical examination without abnormal findings: Secondary | ICD-10-CM | POA: Diagnosis not present

## 2024-01-02 DIAGNOSIS — Z23 Encounter for immunization: Secondary | ICD-10-CM

## 2024-01-02 LAB — MICROALBUMIN / CREATININE URINE RATIO
Creatinine,U: 147.4 mg/dL
Microalb Creat Ratio: 98.6 mg/g — ABNORMAL HIGH (ref 0.0–30.0)
Microalb, Ur: 14.5 mg/dL — ABNORMAL HIGH (ref 0.0–1.9)

## 2024-01-02 MED ORDER — TELMISARTAN 80 MG PO TABS: 80.0000 mg | ORAL_TABLET | Freq: Every day | ORAL | 1 refills | Status: AC

## 2024-01-02 NOTE — Progress Notes (Unsigned)
 Subjective:   Tony Liburd. is a 83 y.o. male who presents for Medicare Annual/Subsequent preventive examination.  Visit Complete: In person  Patient Medicare AWV questionnaire was completed by the patient on 01/02/2024; I have confirmed that all information answered by patient is correct and no changes since this date. Patient is also here for a new symptom of left lower leg swelling for 2 weeks. States that he injure the leg that he knows of. States that it is is a little sore to touch, sometimes hurt when he puts pressure on it. States that there is no SOB, no chest pain, no redness or fever/chills, no other associated symptoms. Pt states that he has been working out and he does the leg machines at the gym, was worried possibly that he might have strained or pulled a muscle in his leg.   HTN -- BP remains elevated today, reviewed previous BP's in the computer which are also elevated. I counseled the patient on increasing his telmisartan  to 80 mg daily and he is agreeable. He denies any SOB, no headaches or chest pain.     Cardiac Risk Factors include: advanced age (>6men, >56 women);diabetes mellitus;dyslipidemia;hypertension;male gender;obesity (BMI >30kg/m2)     Objective:    Today's Vitals   01/02/24 0950 01/02/24 0951  BP: (!) 144/90 (!) 144/90  Pulse: 75   Temp: 98.1 F (36.7 C)   TempSrc: Oral   SpO2: 96%   Weight: 255 lb 1.6 oz (115.7 kg)   Height: 5' 8 (1.727 m)    Body mass index is 38.79 kg/m.     01/04/2024   12:26 PM 10/23/2022    3:31 PM 06/10/2019   12:46 PM 08/15/2015   12:39 PM  Advanced Directives  Does Patient Have a Medical Advance Directive? No No No No   Would patient like information on creating a medical advance directive? Yes (MAU/Ambulatory/Procedural Areas - Information given) No - Patient declined No - Patient declined No - patient declined information      Data saved with a previous flowsheet row definition    Current Medications  (verified) Outpatient Encounter Medications as of 01/02/2024  Medication Sig   Accu-Chek Softclix Lancets lancets Use to check blood sugar once daily Dx: E11.65 Please substitute to any brand preferred by insurance if needed   atorvastatin  (LIPITOR) 40 MG tablet Take 1 tablet (40 mg total) by mouth daily.   Blood Glucose Monitoring Suppl (ACCU-CHEK GUIDE ME) w/Device KIT Use to check blood sugar once daily Dx: E11.65 Please substitute to any brand preferred by insurance if needed   dapagliflozin  propanediol (FARXIGA ) 10 MG TABS tablet Take 10 mg by mouth daily.   dextromethorphan -guaiFENesin  (MUCINEX  DM) 30-600 MG 12hr tablet Take 1 tablet by mouth 2 (two) times daily.   glipiZIDE  (GLUCOTROL ) 10 MG tablet Take 1 tablet (10 mg total) by mouth 2 (two) times daily before a meal. (Patient taking differently: Take 1 tablet (10 mg total) by mouth 2 (two) times daily before a meal.)   glucose blood (ACCU-CHEK GUIDE TEST) test strip Use to check blood sugar once daily Dx: E11.65 Please substitute to any brand preferred by insurance if needed   [DISCONTINUED] telmisartan  (MICARDIS ) 40 MG tablet TAKE 1 TABLET BY MOUTH EVERY DAY   telmisartan  (MICARDIS ) 80 MG tablet Take 1 tablet (80 mg total) by mouth daily.   No facility-administered encounter medications on file as of 01/02/2024.    Allergies (verified) Patient has no known allergies.   History: Past  Medical History:  Diagnosis Date   Diabetes mellitus without complication (HCC)    No past surgical history on file. Family History  Problem Relation Age of Onset   Diabetes Mother    Cancer Mother    Diabetes Father    Hypertension Father    Diabetes Sister    Diabetes Sister    Diabetes Brother    Social History   Socioeconomic History   Marital status: Single    Spouse name: Not on file   Number of children: Not on file   Years of education: Not on file   Highest education level: Not on file  Occupational History   Not on file   Tobacco Use   Smoking status: Never   Smokeless tobacco: Never  Substance and Sexual Activity   Alcohol use: No   Drug use: No    Frequency: 3.0 times per week   Sexual activity: Not on file  Other Topics Concern   Not on file  Social History Narrative   Not on file   Social Drivers of Health   Financial Resource Strain: Low Risk  (01/02/2024)   Overall Financial Resource Strain (CARDIA)    Difficulty of Paying Living Expenses: Not hard at all  Food Insecurity: No Food Insecurity (01/02/2024)   Hunger Vital Sign    Worried About Running Out of Food in the Last Year: Never true    Ran Out of Food in the Last Year: Never true  Transportation Needs: No Transportation Needs (01/02/2024)   PRAPARE - Administrator, Civil Service (Medical): No    Lack of Transportation (Non-Medical): No  Physical Activity: Insufficiently Active (01/02/2024)   Exercise Vital Sign    Days of Exercise per Week: 2 days    Minutes of Exercise per Session: 20 min  Stress: No Stress Concern Present (01/02/2024)   [employer] of Occupational Health - Occupational Stress Questionnaire    Feeling of Stress: Not at all  Social Connections: Moderately Isolated (01/02/2024)   Social Connection and Isolation Panel    Frequency of Communication with Friends and Family: More than three times a week    Frequency of Social Gatherings with Friends and Family: Twice a week    Attends Religious Services: More than 4 times per year    Active Member of [employer] or Organizations: No    Attends Banker Meetings: Never    Marital Status: Divorced  Review of Systems  All other systems reviewed and are negative.    Tobacco Counseling Counseling given: Not Answered   Clinical Intake:  Pre-visit preparation completed: Yes  Pain : No/denies pain     Nutritional Status: BMI > 30  Obese Nutritional Risks: None Diabetes: Yes CBG done?: No Did pt. bring in CBG monitor from home?: No  How  often do you need to have someone help you when you read instructions, pamphlets, or other written materials from your doctor or pharmacy?: 1 - Never  Interpreter Needed?: No      Activities of Daily Living    01/02/2024   10:27 AM  In your present state of health, do you have any difficulty performing the following activities:  Hearing? 0  Vision? 0  Comment had lasik surgery  Difficulty concentrating or making decisions? 0  Walking or climbing stairs? 0  Dressing or bathing? 0  Doing errands, shopping? 0  Preparing Food and eating ? N  Using the Toilet? N  In the past six months,  have you accidently leaked urine? N  Do you have problems with loss of bowel control? N  Managing your Medications? N  Managing your Finances? N  Housekeeping or managing your Housekeeping? N    Patient Care Team: Ozell Heron HERO, MD as PCP - General (Family Medicine) Lionell Jon DEL, [facility] (Pharmacist)  Indicate any recent Medical Services you may have received from other than [facility] providers in the past year (date may be approximate). Physical Exam Constitutional:      Appearance: Normal appearance. He is obese.  Cardiovascular:     Rate and Rhythm: Normal rate and regular rhythm.     Pulses: Normal pulses.     Heart sounds: Normal heart sounds.  Pulmonary:     Effort: Pulmonary effort is normal.     Breath sounds: Normal breath sounds. No wheezing.  Musculoskeletal:        General: Swelling (left lateral leg is more promninent than the right leg, no pitting edema seen, no redness, no oopen wounds) present.  Neurological:     Mental Status: He is alert and oriented to person, place, and time. Mental status is at baseline.  Psychiatric:        Mood and Affect: Mood normal.        Behavior: Behavior normal.        Assessment:   This is a routine wellness examination for Knute.  Hearing/Vision screen No results found.   Goals Addressed             This Visit's Progress     DIET - REDUCE SUGAR INTAKE       Counseled patient on reducing sugar and starches in diet.        Depression Screen    01/02/2024   10:04 AM 01/22/2023    8:51 AM 12/15/2022   12:57 PM 10/20/2022    3:59 PM  PHQ 2/9 Scores  PHQ - 2 Score 0 0 0 0  PHQ- 9 Score 0 0 0     Fall Risk    01/02/2024   10:05 AM 01/22/2023    8:52 AM  Fall Risk   Falls in the past year? 0 0  Number falls in past yr: 0 0  Injury with Fall? 0 0  Risk for fall due to : No Fall Risks No Fall Risks  Follow up Falls evaluation completed Falls evaluation completed    MEDICARE RISK AT HOME: Medicare Risk at Home Any stairs in or around the home?: No If so, are there any without handrails?: No Home free of loose throw rugs in walkways, pet beds, electrical cords, etc?: No Adequate lighting in your home to reduce risk of falls?: Yes Life alert?: No Use of a cane, walker or w/c?: No Grab bars in the bathroom?: No Shower chair or bench in shower?: No Elevated toilet seat or a handicapped toilet?: No  TIMED UP AND GO:  Was the test performed?  Yes  Length of time to ambulate 10 feet: 8 sec Gait steady and fast without use of assistive device    Cognitive Function:        01/02/2024   10:29 AM  6CIT Screen  What Year? 0 points  What month? 0 points  What time? 0 points  Count back from 20 0 points  Months in reverse 2 points  Repeat phrase 2 points  Total Score 4 points    Immunizations Immunization History  Administered Date(s) Administered   Influenza,inj,Quad PF,6+ Mos 03/05/2021  Influenza-Unspecified 02/14/2020, 03/01/2023   PFIZER(Purple Top)SARS-COV-2 Vaccination 08/06/2019, 09/02/2019, 02/13/2020   Pfizer Covid-19 Vaccine Bivalent Booster 67yrs & up 03/05/2021   Tdap 03/01/2023    TDAP status: Up to date  Flu Vaccine status: Up to date  Pneumococcal vaccine status: Up to date  Covid-19 vaccine status: Completed vaccines  Qualifies for Shingles Vaccine? Yes   Zostavax  completed No   Shingrix Completed?: No.    Education has been provided regarding the importance of this vaccine. Patient has been advised to call insurance company to determine out of pocket expense if they have not yet received this vaccine. Advised may also receive vaccine at local pharmacy or Health Dept. Verbalized acceptance and understanding.  Screening Tests Health Maintenance  Topic Date Due   COVID-19 Vaccine (5 - 2024-25 season) 02/04/2023   INFLUENZA VACCINE  01/04/2024   Zoster Vaccines- Shingrix (1 of 2) 04/03/2024 (Originally 05/22/1960)   Pneumococcal Vaccine: 50+ Years (2 of 2 - PCV) 01/01/2025 (Originally 08/26/2021)   OPHTHALMOLOGY EXAM  01/11/2024   FOOT EXAM  01/22/2024   HEMOGLOBIN A1C  05/21/2024   Diabetic kidney evaluation - eGFR measurement  11/19/2024   Diabetic kidney evaluation - Urine ACR  01/01/2025   Medicare Annual Wellness (AWV)  01/01/2025   DTaP/Tdap/Td (2 - Td or Tdap) 02/28/2033   Hepatitis B Vaccines  Aged Out   HPV VACCINES  Aged Out   Meningococcal B Vaccine  Aged Out    Health Maintenance  Health Maintenance Due  Topic Date Due   COVID-19 Vaccine (5 - 2024-25 season) 02/04/2023   INFLUENZA VACCINE  01/04/2024    Colorectal cancer screening: No longer required.   Lung Cancer Screening: (Low Dose CT Chest recommended if Age 56-80 years, 20 pack-year currently smoking OR have quit w/in 15years.) does not qualify.   Lung Cancer Screening Referral: N/A  Additional Screening:  Hepatitis C Screening: does not qualify;   Vision Screening: Recommended annual ophthalmology exams for early detection of glaucoma and other disorders of the eye. Is the patient up to date with their annual eye exam?  Yes  Who is the provider or what is the name of the office in which the patient attends annual eye exams? Dr. Lorrene Ophthalmologist on [facility] If pt is not established with a provider, would they like to be referred to a provider to establish care?  N/A.   Dental Screening: Recommended annual dental exams for proper oral hygiene  Diabetic Foot Exam: Diabetic Foot Exam: Completed 01/22/2023  Community Resource Referral / Chronic Care Management: CRR required this visit?  already referred  CCM required this visit?  No     Plan:    Problem List Items Addressed This Visit       Unprioritized   Type 2 diabetes mellitus with hyperglycemia (HCC)   Relevant Medications   telmisartan  (MICARDIS ) 80 MG tablet   Other Relevant Orders   Microalbumin / creatinine urine ratio (Completed)   HTN (hypertension) (Chronic)   Relevant Medications   BP remains elevated despite treatment, I discussed this with the patient and he is willing to increase the telmisartan  to 80 mg daily. I instructed him to take 2 tablets of the 40 mg that he has at home, then get the new script. I extensively went over his medication list with him and hightlighted the medication on the AVS.   telmisartan  (MICARDIS ) 80 MG tablet   Other Visit Diagnoses       Encounter for Medicare annual wellness  exam    -  Primary     Localized swelling of left lower extremity       Relevant Orders   Left leg there is a prominence of the lateral portion of the left leg, around the location of the flbularis brevis muscle, there is a clinically low suspicion for DVT, however will rule out with US  of the lower extremity. He does have hyperpigmentation around the ankles and hyperkeratosis of the skin there indicating some mild chronic venous stasis changes. Ordering US .  VAS US  LOWER EXTREMITY VENOUS (DVT)     Advanced directives, counseling/discussion       Relevant Orders   AMB Referral VBCI Care Management       I have personally reviewed and noted the following in the patient's chart:   Medical and social history Use of alcohol, tobacco or illicit drugs  Current medications and supplements including opioid prescriptions. Patient is not currently taking opioid  prescriptions. Functional ability and status Nutritional status Physical activity Advanced directives List of other physicians Hospitalizations, surgeries, and ER visits in previous 12 months Vitals Screenings to include cognitive, depression, and falls Referrals and appointments  In addition, I have reviewed and discussed with patient certain preventive protocols, quality metrics, and best practice recommendations. A written personalized care plan for preventive services as well as general preventive health recommendations were provided to patient.     Heron CHRISTELLA Sharper, MD   01/04/2024   After Visit Summary: (In Person-Printed) AVS printed and given to the patient  Nurse Notes: N/A

## 2024-01-03 ENCOUNTER — Ambulatory Visit: Admitting: Family Medicine

## 2024-01-03 ENCOUNTER — Ambulatory Visit: Payer: Self-pay | Admitting: Family Medicine

## 2024-01-04 ENCOUNTER — Ambulatory Visit (HOSPITAL_COMMUNITY)
Admission: RE | Admit: 2024-01-04 | Discharge: 2024-01-04 | Disposition: A | Discharge status: Home / Self Care | Source: Ambulatory Visit | Attending: Family Medicine | Admitting: Family Medicine

## 2024-01-04 DIAGNOSIS — R2242 Localized swelling, mass and lump, left lower limb: Secondary | ICD-10-CM | POA: Diagnosis not present

## 2024-01-07 ENCOUNTER — Other Ambulatory Visit

## 2024-01-09 ENCOUNTER — Telehealth: Payer: Self-pay

## 2024-01-09 ENCOUNTER — Other Ambulatory Visit (INDEPENDENT_AMBULATORY_CARE_PROVIDER_SITE_OTHER)

## 2024-01-09 DIAGNOSIS — E1165 Type 2 diabetes mellitus with hyperglycemia: Secondary | ICD-10-CM

## 2024-01-09 DIAGNOSIS — Z7984 Long term (current) use of oral hypoglycemic drugs: Secondary | ICD-10-CM

## 2024-01-09 DIAGNOSIS — I10 Essential (primary) hypertension: Secondary | ICD-10-CM

## 2024-01-09 NOTE — Progress Notes (Signed)
 Spoke with patient and he is aware, he states he actually hasn't checked his mailbox recently. Will let us  know once he checks if it is not there. Thank you!

## 2024-01-09 NOTE — Progress Notes (Signed)
 01/09/2024 Name: Tony Ambrosino Kersten Jr. MRN: [id number] DOB: 06/05/1941  Chief Complaint  Patient presents with   Medication Management   Diabetes   Hypertension    Reshad Saab. is a 83 y.o. year old male who presented for a telephone visit.   They were referred to the pharmacist by their PCP for assistance in managing diabetes, medication access, and complex medication management.    Subjective:  Care Team: Primary Care Provider: Ozell Heron HERO, MD ; Next Scheduled Visit: 11/29/23  Medication Access/Adherence  Current Pharmacy:  [id number] [address], [place] - [address] AT [facility] [address] [place] [address] Phone: [phone] Fax: [phone]   Patient reports affordability concerns with their medications: Not with his current medications, had issues with cost of newer DM therapies in the past Patient reports access/transportation concerns to their pharmacy: No  Patient reports adherence concerns with their medications:  Yes  - has self-adjusted meds in the past if he didn't feel like he was responding well   Diabetes:  Current medications: Glipizide  10mg  BID (taking 1 tab daily, did not like the way evening tab made him feel), Farxiga  10mg  daily Medications tried in the past: Metformin , Lantus   Current glucose readings: None - cannot recall last time he checked, says he has a meter but not sure it is working appropriately so stopped using, cannot recall name of meter and does not have it nearby at time of call   Observed patterns:  Patient denies hypoglycemic s/sx including dizziness, shakiness, sweating. Patient denies hyperglycemic symptoms including polyuria, polydipsia, polyphagia, nocturia, neuropathy, blurred vision.   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  Patient denies hypertensive symptoms including headache, chest pain, shortness of breath       Objective:  Lab Results  Component Value Date   HGBA1C 8.9 (A) 11/20/2023    Lab Results  Component Value Date   CREATININE 1.20 11/20/2023   BUN 15 11/20/2023   NA 134 (L) 11/20/2023   K 4.3 11/20/2023   CL 100 11/20/2023   CO2 30 11/20/2023    Lab Results  Component Value Date   CHOL 207 (H) 11/20/2023   HDL 31.00 (L) 11/20/2023   LDLCALC 120 (H) 11/20/2023   LDLDIRECT 145.0 12/15/2022   TRIG 280.0 (H) 11/20/2023   CHOLHDL 7 11/20/2023    Medications Reviewed Today     Reviewed by Lionell Jon DEL, RPH (Pharmacist) on 01/09/24 at 1347  Med List Status: <None>   Medication Order Taking? Sig Documenting Provider Last Dose Status Informant  Accu-Chek Softclix Lancets lancets [id number]  Use to check blood sugar once daily Dx: E11.65 Please substitute to any brand preferred by insurance if needed Ozell Heron HERO, MD  Active   atorvastatin  (LIPITOR) 40 MG tablet [id number] Yes Take 1 tablet (40 mg total) by mouth daily. Ozell Heron HERO, MD  Active   Blood Glucose Monitoring Suppl (ACCU-CHEK GUIDE ME) w/Device KIT [id number]  Use to check blood sugar once daily Dx: E11.65 Please substitute  to any brand preferred by insurance if needed Ozell Heron HERO, MD  Active   dapagliflozin  propanediol (FARXIGA ) 10 MG TABS tablet [id number] Yes Take 10 mg by mouth daily. [provider]  Active   dextromethorphan -guaiFENesin  (MUCINEX  DM) 30-600 MG 12hr tablet [id number]  Take 1 tablet by mouth  2 (two) times daily. Ozell Heron HERO, MD  Active   glipiZIDE  (GLUCOTROL ) 10 MG tablet [id number] Yes Take 1 tablet (10 mg total) by mouth 2 (two) times daily before a meal.  Patient taking differently: Take 10 mg by mouth 2 (two) times daily before a meal. Taking 1 tab daily   Ozell Heron HERO, MD  Active   glucose blood (ACCU-CHEK GUIDE TEST) test strip [id number]  Use to check blood sugar once daily Dx: E11.65 Please substitute to any brand preferred by insurance if needed Ozell Heron HERO, MD  Active   telmisartan  (MICARDIS ) 80 MG tablet [id number] Yes Take 1 tablet (80 mg total) by mouth daily. Ozell Heron HERO, MD  Active               Assessment/Plan:   Diabetes: - Currently uncontrolled - Reviewed long term cardiovascular and renal outcomes of uncontrolled blood sugar - Reviewed goal A1c, goal fasting, and goal 2 hour post prandial glucose - Reviewed dietary modifications including low carb diet - Continue current medication therapy - Patient denies personal or family history of multiple endocrine neoplasia type 2, medullary thyroid cancer; personal history of pancreatitis or gallbladder disease. -Bring DM meter to our next f/u for review of use    Hyperlipidemia/ASCVD Risk Reduction: - Currently uncontrolled.  - Reviewed long term complications of uncontrolled cholesterol -Continue current medication therapy, get updated lipid panel at next PCP visit   Hypertension: - Currently controlled - Reviewed long term cardiovascular and renal outcomes of uncontrolled blood pressure - Reviewed appropriate blood pressure monitoring technique and reviewed goal blood pressure. Recommended to check home blood pressure and heart rate daily - Recommend to continue current medication therapy     Follow Up Plan: 2 weeks  [name], PharmD Clinical Pharmacist [phone]

## 2024-01-09 NOTE — Telephone Encounter (Signed)
 Gave [facility] a call per [facility] Angela,pt has not received refill on Farxiga .Spoke with [facility] representative explain they have mail out refill at pt address, pt should have received it on 12/28/23 with a tracking #[id number].

## 2024-01-11 ENCOUNTER — Ambulatory Visit: Payer: Self-pay

## 2024-01-11 DIAGNOSIS — M79605 Pain in left leg: Secondary | ICD-10-CM

## 2024-01-11 NOTE — Telephone Encounter (Signed)
 Left a message for the patient to return my call.

## 2024-01-11 NOTE — Telephone Encounter (Signed)
 Patient informed of the message below, is aware the referral was placed and someone will contact him with appt info.

## 2024-01-11 NOTE — Telephone Encounter (Signed)
 Ok to place referral to sport medicine for left leg pain, we already ruled out DVT and he was previously examined by me in office

## 2024-01-11 NOTE — Telephone Encounter (Signed)
 FYI Only or Action Required?: FYI only for provider.  Patient was last seen in primary care on 01/02/2024 by Tony Heron HERO, MD.  Called Nurse Triage reporting Leg Pain.  Symptoms began about a month ago.  Interventions attempted: Rest, hydration, or home remedies.  Symptoms are: unchanged.  Triage Disposition: See PCP When Office is Open (Within 3 Days)  Patient/caregiver understands and will follow disposition?: Yes, but will wait   Copied from CRM [id number]. Topic: Clinical - Red Word Triage >> Jan 11, 2024  9:01 AM Robinson H wrote: [facility] that prompted transfer to Nurse Triage: Had ultrasound done on left leg and was told nothing wrong with it but now having throbbing pain that goes and comes, can move around okay when standing for a period of time starts to hurt Reason for Disposition  [1] MODERATE pain (e.g., interferes with normal activities, limping) AND [2] present > 3 days  Additional Information  Negative: [1] Thigh, calf, or ankle swelling AND [2] only 1 side    Had ultrasound on 01/04/24  Answer Assessment - Initial Assessment Questions 1. ONSET: When did the pain start?      One month ago 2. LOCATION: Where is the pain located?      Left leg 3. PAIN: How bad is the pain?    (Scale 1-10; or mild, moderate, severe)     throbbing 4. WORK OR EXERCISE: Has there been any recent work or exercise that involved this part of the body?       5. CAUSE: What do you think is causing the leg pain?     Unsure pulled muscle 6. OTHER SYMPTOMS: Do you have any other symptoms? (e.g., chest pain, back pain, breathing difficulty, swelling, rash, fever, numbness, weakness)     Mild swelling intermittently  Additional info: Ultrasound on 01/04/24 showed no acute process.  Protocols used: Leg Pain-A-AH

## 2024-01-11 NOTE — Addendum Note (Signed)
 Addended by: CHRISTYNE IDELL LABOR on: 01/11/2024 02:31 PM   Modules accepted: Orders

## 2024-01-15 ENCOUNTER — Encounter: Payer: Self-pay | Admitting: Family Medicine

## 2024-01-15 ENCOUNTER — Ambulatory Visit: Admitting: Family Medicine

## 2024-01-15 VITALS — BP 136/70 | HR 75 | Temp 98.7°F | Ht 68.0 in | Wt 251.8 lb

## 2024-01-15 DIAGNOSIS — M7989 Other specified soft tissue disorders: Secondary | ICD-10-CM | POA: Diagnosis not present

## 2024-01-15 DIAGNOSIS — I10 Essential (primary) hypertension: Secondary | ICD-10-CM

## 2024-01-15 NOTE — Progress Notes (Signed)
 Established Patient Office Visit  Subjective   Patient ID: Tony Russell., male    DOB: 04/06/1941  Age: 83 y.o. MRN: [id number]  Chief Complaint  Patient presents with   Leg Pain    Patient states the left leg pain and swelling is better    Pt is here for BP followup. He is taking the 80 mg of telmisartan , states it seems to be working for him, he denies any side effects. Pt reports he is also taking his farxiga  10 mg daily as prescribed. BP today performed in office and is now <140.   Pt reports that he has been resting his left leg and the pain and soreness is improving, no swelling in the lower extremities. We reviewed the results of the US  and it was negative for DVT.     Current Outpatient Medications  Medication Instructions   Accu-Chek Softclix Lancets lancets Use to check blood sugar once daily Dx: E11.65 Please substitute to any brand preferred by insurance if needed   atorvastatin  (LIPITOR) 40 mg, Oral, Daily   Blood Glucose Monitoring Suppl (ACCU-CHEK GUIDE ME) w/Device KIT Use to check blood sugar once daily Dx: E11.65 Please substitute to any brand preferred by insurance if needed   dapagliflozin  propanediol (FARXIGA ) 10 mg, Daily   dextromethorphan -guaiFENesin  (MUCINEX  DM) 30-600 MG 12hr tablet 1 tablet, Oral, 2 times daily   glipiZIDE  (GLUCOTROL ) 10 mg, Oral, 2 times daily before meals   glucose blood (ACCU-CHEK GUIDE TEST) test strip Use to check blood sugar once daily Dx: E11.65 Please substitute to any brand preferred by insurance if needed   telmisartan  (MICARDIS ) 80 mg, Oral, Daily    Patient Active Problem List   Diagnosis Date Noted   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     Objective:     BP 136/70   Pulse 75   Temp 98.7 F (37.1 C) (Oral)   Ht 5' 8 (1.727 m)   Wt 251 lb 12.8 oz (114.2 kg)   SpO2 97%   BMI 38.29 kg/m    Physical Exam Constitutional:      Appearance: Normal appearance. He is obese.  Eyes:     Conjunctiva/sclera: Conjunctivae normal.  Pulmonary:     Effort: Pulmonary effort is normal.  Musculoskeletal:     Left lower leg: Edema (there is mild swelling on the lower lateral portion of the leg but no tenderness with palpation, no obvious deformity) present.  Neurological:     Mental Status: He is alert.      No results found for any visits on 01/15/24.    The ASCVD Risk score (Arnett DK, et al., 2019) failed to calculate for the following reasons:   The 2019 ASCVD risk score is only valid for ages 72 to 34    Assessment & Plan:  Leg swelling  Primary hypertension   BP is now well controlled on 80 mg telmisartan , he was advised to continue all medications as prescribed. Leg swelling most likely a muscle injury and appears to be improving per the patient, the skin today is not a taut as it was previously. He was advised to hold of on working out until the  pain resolves. RTC in 3 months for A1C check. 20 minutes spent with patient today following up on 1 acute problem and 1 chronic problem.   No follow-ups on file.    Heron CHRISTELLA Sharper, MD

## 2024-01-16 ENCOUNTER — Telehealth: Payer: Self-pay | Admitting: *Deleted

## 2024-01-16 NOTE — Progress Notes (Signed)
 Complex Care Management Note Care Guide Note  01/16/2024 Name: Tony Russell. MRN: [id number] DOB: Oct 13, 1940   Complex Care Management Outreach Attempts: An unsuccessful telephone outreach was attempted today to offer the patient information about available complex care management services.  Follow Up Plan:  Additional outreach attempts will be made to offer the patient complex care management information and services.   Encounter Outcome:  No Answer  Thedford Franks, CMA [place]  [facility], [facility] Guide Direct Dial: [phone]  Fax: [phone] Website: [email or web address]

## 2024-01-17 NOTE — Progress Notes (Signed)
 Complex Care Management Note Care Guide Note  01/17/2024 Name: Tony Russell. MRN: [id number] DOB: [birth year]   Complex Care Management Outreach Attempts: A second unsuccessful outreach was attempted today to offer the patient with information about available complex care management services.  Follow Up Plan:  Additional outreach attempts will be made to offer the patient complex care management information and services.   Encounter Outcome:  No Answer  Thedford Franks, CMA [place]  [facility], [facility] Guide Direct Dial: [phone]  Fax: [phone] Website: [email or web address]

## 2024-01-18 NOTE — Progress Notes (Signed)
 Complex Care Management Note Care Guide Note  01/18/2024 Name: Tony Russell. MRN: [id number] DOB: 12/11/1940   Complex Care Management Outreach Attempts: A third unsuccessful outreach was attempted today to offer the patient with information about available complex care management services.  Follow Up Plan:  No further outreach attempts will be made at this time. We have been unable to contact the patient to offer or enroll patient in complex care management services.  Encounter Outcome:  No Answer  Thedford Franks, CMA [place]  [facility], [facility] Guide Direct Dial: [phone]  Fax: [phone] Website: [email or web address]

## 2024-01-23 ENCOUNTER — Ambulatory Visit

## 2024-01-23 DIAGNOSIS — I10 Essential (primary) hypertension: Secondary | ICD-10-CM | POA: Diagnosis not present

## 2024-01-23 DIAGNOSIS — E1165 Type 2 diabetes mellitus with hyperglycemia: Secondary | ICD-10-CM | POA: Diagnosis not present

## 2024-01-23 NOTE — Progress Notes (Signed)
 01/23/2024 Name: Tony Russell. MRN: [id number] DOB: 02/07/1941  Chief Complaint  Patient presents with   Medication Management   Diabetes   Hypertension    Britt Petroni. is a 83 y.o. year old male who presented for a telephone visit.   They were referred to the pharmacist by their PCP for assistance in managing diabetes, medication access, and complex medication management.    Subjective:  Care Team: Primary Care Provider: Ozell Heron HERO, MD ; Next Scheduled Visit: 04/21/24  Medication Access/Adherence  Current Pharmacy:  [id number] [address], [place] - [address] AT [facility] [address] [place] [address] Phone: [phone] Fax: [phone]   Patient reports affordability concerns with their medications: Not with his current medications, had issues with cost of newer DM therapies in the past Patient reports access/transportation concerns to their pharmacy: No  Patient reports adherence concerns with their medications:  Yes  - has self-adjusted meds in the past if he didn't feel like he was responding well   Diabetes:  Current medications: Glipizide  10mg  BID (taking 1 tab daily, did not like the way evening tab made him feel), Farxiga  10mg  daily Medications tried in the past: Metformin , Lantus   Current glucose readings: None - cannot recall last time he checked, says he has a meter but not sure it is working appropriately so stopped using, cannot recall name of meter and does not have it nearby at time of call  Forgot to bring meter to appt for education on how to use   Observed patterns:  Patient denies hypoglycemic s/sx including dizziness, shakiness, sweating. Patient denies hyperglycemic symptoms including polyuria, polydipsia, polyphagia, nocturia, neuropathy, blurred vision.   Hyperlipidemia/ASCVD Risk Reduction  Current lipid lowering medications: Atorvastatin  40mg   Medications tried in the past:  atorvastatin  in past stopped due to myopathy but patient reports this lingered well after stopping the medication, open to a retrial at last PCP visit  Antiplatelet regimen: None  Hypertension:  Current medications: Telmisartan  80mg  Medications previously tried: None  Patient has a validated, automated, upper arm home BP cuff Current blood pressure readings readings: did not bring log, says they are looking good  158/92 in office, did not come down on recheck, reports he had taken his medication  Patient denies hypotensive s/sx including dizziness, lightheadedness.  Patient denies hypertensive symptoms including headache, chest pain, shortness of breath       Objective:  Lab Results  Component Value Date   HGBA1C 8.9 (A) 11/20/2023    Lab Results  Component Value Date   CREATININE 1.20 11/20/2023   BUN 15 11/20/2023   NA 134 (L) 11/20/2023   K 4.3 11/20/2023   CL 100 11/20/2023   CO2 30 11/20/2023    Lab Results  Component Value Date   CHOL 207 (H) 11/20/2023   HDL 31.00 (L) 11/20/2023   LDLCALC 120 (H) 11/20/2023   LDLDIRECT 145.0 12/15/2022   TRIG 280.0 (H) 11/20/2023   CHOLHDL 7 11/20/2023    Medications Reviewed Today     Reviewed by Lionell Jon DEL, RPH (Pharmacist) on 01/23/24 at 1243  Med List Status: <None>   Medication Order Taking? Sig Documenting Provider Last Dose Status Informant  Accu-Chek Softclix Lancets lancets [id number]  Use to check blood sugar once daily Dx: E11.65 Please substitute to any brand preferred by insurance if needed Ozell Heron HERO, MD  Active   atorvastatin  (LIPITOR) 40 MG tablet [id number] Yes Take 1 tablet (  40 mg total) by mouth daily. Ozell Heron HERO, MD  Active   Blood Glucose Monitoring Suppl (ACCU-CHEK GUIDE ME) w/Device KIT [id number]  Use to check blood sugar once daily Dx: E11.65 Please substitute to any brand preferred by insurance if needed Ozell Heron HERO, MD  Active   dapagliflozin  propanediol (FARXIGA ) 10  MG TABS tablet [id number] Yes Take 10 mg by mouth daily. [provider]  Active   dextromethorphan -guaiFENesin  (MUCINEX  DM) 30-600 MG 12hr tablet [id number]  Take 1 tablet by mouth 2 (two) times daily. Ozell Heron HERO, MD  Active   glipiZIDE  (GLUCOTROL ) 10 MG tablet [id number]  Take 1 tablet (10 mg total) by mouth 2 (two) times daily before a meal.  Patient taking differently: Take 10 mg by mouth 2 (two) times daily before a meal. Taking 1 tab daily   Ozell Heron HERO, MD  Active   glucose blood (ACCU-CHEK GUIDE TEST) test strip [id number]  Use to check blood sugar once daily Dx: E11.65 Please substitute to any brand preferred by insurance if needed Ozell Heron HERO, MD  Active   telmisartan  (MICARDIS ) 80 MG tablet [id number] Yes Take 1 tablet (80 mg total) by mouth daily. Ozell Heron HERO, MD  Active               Assessment/Plan:   Diabetes: - Currently uncontrolled - Reviewed long term cardiovascular and renal outcomes of uncontrolled blood sugar - Reviewed goal A1c, goal fasting, and goal 2 hour post prandial glucose - Reviewed dietary modifications including low carb diet - Continue current medication therapy - Patient denies personal or family history of multiple endocrine neoplasia type 2, medullary thyroid cancer; personal history of pancreatitis or gallbladder disease. -Bring DM meter to our next f/u for review of use    Hyperlipidemia/ASCVD Risk Reduction: - Currently uncontrolled.  - Reviewed long term complications of uncontrolled cholesterol -Continue current medication therapy, get updated lipid panel at next PCP visit   Hypertension: - Currently uncontrolled - Reviewed long term cardiovascular and renal outcomes of uncontrolled blood pressure - Reviewed appropriate blood pressure monitoring technique and reviewed goal blood pressure. Recommended to check home blood pressure and heart rate daily, bring log to follow up - Recommend to continue current  medication therapy     Follow Up Plan: 01/28/24  [name], PharmD Clinical Pharmacist [phone]

## 2024-01-28 ENCOUNTER — Ambulatory Visit

## 2024-01-30 ENCOUNTER — Ambulatory Visit (INDEPENDENT_AMBULATORY_CARE_PROVIDER_SITE_OTHER)

## 2024-01-30 DIAGNOSIS — E1165 Type 2 diabetes mellitus with hyperglycemia: Secondary | ICD-10-CM

## 2024-01-30 DIAGNOSIS — I10 Essential (primary) hypertension: Secondary | ICD-10-CM

## 2024-01-30 MED ORDER — ACCU-CHEK SOFTCLIX LANCETS MISC: 3 refills | Status: AC

## 2024-01-30 MED ORDER — ACCU-CHEK GUIDE TEST VI STRP: ORAL_STRIP | 3 refills | Status: AC

## 2024-01-30 NOTE — Progress Notes (Signed)
 01/30/2024 Name: Tony Walls Hammer Jr. MRN: [id number] DOB: 11-25-1940  Chief Complaint  Patient presents with   Medication Management   Diabetes   Hypertension    Tony Russell. is a 83 y.o. year old male who presented for a telephone visit.   They were referred to the pharmacist by their PCP for assistance in managing diabetes, medication access, and complex medication management.    Subjective:  Care Team: Primary Care Provider: Ozell Heron HERO, Russell ; Next Scheduled Visit: Russell  Medication Access/Adherence  Current Pharmacy:  [id number] [address], [place] - [address] AT [facility] [address] [place] [address] Phone: [phone] Fax: [phone]   Patient reports affordability concerns with their medications: Not with his current medications, had issues with cost of newer DM therapies in the past Patient reports access/transportation concerns to their pharmacy: No  Patient reports adherence concerns with their medications:  Yes  - has self-adjusted meds in the past if he didn't feel like he was responding well   Diabetes:  Current medications: Glipizide  10mg  BID (taking 1 tab daily, did not like the way evening tab made him feel), Farxiga  10mg  daily Medications tried in the past: Metformin , Lantus   Current glucose readings: None - has not been able to check. Brings in BG testing supplies. Strips are not compatible with his Accu Chek Guide Me Meter. Resending rx for lancets and strips for compatibility.    Observed patterns:  Patient denies hypoglycemic s/sx including dizziness, shakiness, sweating. Patient denies hyperglycemic symptoms including polyuria, polydipsia, polyphagia, nocturia, neuropathy, blurred vision.   Hyperlipidemia/ASCVD Risk Reduction  Current lipid lowering medications: Atorvastatin  40mg   Medications tried in the past: atorvastatin  in past stopped due to myopathy but patient reports this  lingered well after stopping the medication, open to a retrial at last PCP visit  Antiplatelet regimen: None  Hypertension:  Current medications: Telmisartan  80mg  Medications previously tried: None  Patient has a validated, automated, upper arm home BP cuff Current blood pressure readings readings: ranging from 135-185/70-95. Today 141/85   Patient denies hypotensive s/sx including dizziness, lightheadedness.  Patient denies hypertensive symptoms including headache, chest pain, shortness of breath    Objective:  Lab Results  Component Value Date   HGBA1C 8.9 (A) 11/20/2023    Lab Results  Component Value Date   CREATININE 1.20 11/20/2023   BUN 15 11/20/2023   NA 134 (L) 11/20/2023   K 4.3 11/20/2023   CL 100 11/20/2023   CO2 30 11/20/2023    Lab Results  Component Value Date   CHOL 207 (H) 11/20/2023   HDL 31.00 (L) 11/20/2023   LDLCALC 120 (H) 11/20/2023   LDLDIRECT 145.0 12/15/2022   TRIG 280.0 (H) 11/20/2023   CHOLHDL 7 11/20/2023    Medications Reviewed Today     Reviewed by Tony Russell, RPH (Pharmacist) on 01/30/24 at [phone]  Med List Status: <None>   Medication Order Taking? Sig Documenting Provider Last Dose Status Informant  Accu-Chek Softclix Lancets lancets [id number]  Use to check blood sugar once daily Dx: E11.65 Tony Russell   atorvastatin  (LIPITOR) 40 MG tablet [id number] Yes Take 1 tablet (40 mg total) by mouth daily. Tony Russell   Blood Glucose Monitoring Suppl (ACCU-CHEK GUIDE ME) w/Device KIT [id number]  Use to check blood sugar once daily Dx: E11.65 Please substitute to any brand preferred by insurance if needed Tony Russell  dapagliflozin  propanediol (FARXIGA ) 10 MG TABS tablet [id number] Yes Take 10 mg by mouth daily. Provider, Historical, Russell  Russell   dextromethorphan -guaiFENesin  (MUCINEX  DM) 30-600 MG 12hr tablet [id number]  Take 1 tablet by mouth 2 (two) times daily. Tony Russell   glipiZIDE  (GLUCOTROL ) 10 MG tablet [id number]  Take 1 tablet (10 mg total) by mouth 2 (two) times daily before a meal.  Patient taking differently: Take 10 mg by mouth 2 (two) times daily before a meal. Taking 1 tab daily   Tony Russell   glucose blood (ACCU-CHEK GUIDE TEST) test strip [id number]  Use to check blood sugar once daily Dx: E11.65 Tony Russell   telmisartan  (MICARDIS ) 80 MG tablet [id number] Yes Take 1 tablet (80 mg total) by mouth daily. Tony Russell               Assessment/Plan:   Diabetes: - Currently uncontrolled - Reviewed long term cardiovascular and renal outcomes of uncontrolled blood sugar - Reviewed goal A1c, goal fasting, and goal 2 hour post prandial glucose - Reviewed dietary modifications including low carb diet - Continue current medication therapy. Pick up new testing supplies and check BG once daily at varying times - Patient denies personal or family history of multiple endocrine neoplasia type 2, medullary thyroid cancer; personal history of pancreatitis or gallbladder disease.    Hyperlipidemia/ASCVD Risk Reduction: - Currently uncontrolled.  - Reviewed long term complications of uncontrolled cholesterol -Continue current medication therapy, get updated lipid panel at next PCP visit   Hypertension: - Currently uncontrolled - Reviewed long term cardiovascular and renal outcomes of uncontrolled blood pressure - Reviewed appropriate blood pressure monitoring technique and reviewed goal blood pressure. Recommended to check home blood pressure and heart rate daily, bring log to follow up -Continue current med therapy, bring log to next visit. If elevated, consider addition of low dose amlodipine or thiazide. Patient prefers to work on lifestyle changes at today's visit.    Follow Up Plan: 02/20/24  [name], PharmD Clinical Pharmacist [phone]

## 2024-02-06 DIAGNOSIS — N182 Chronic kidney disease, stage 2 (mild): Secondary | ICD-10-CM | POA: Diagnosis not present

## 2024-02-06 DIAGNOSIS — E1122 Type 2 diabetes mellitus with diabetic chronic kidney disease: Secondary | ICD-10-CM | POA: Diagnosis not present

## 2024-02-06 DIAGNOSIS — Z008 Encounter for other general examination: Secondary | ICD-10-CM | POA: Diagnosis not present

## 2024-02-06 DIAGNOSIS — E785 Hyperlipidemia, unspecified: Secondary | ICD-10-CM | POA: Diagnosis not present

## 2024-02-06 DIAGNOSIS — I129 Hypertensive chronic kidney disease with stage 1 through stage 4 chronic kidney disease, or unspecified chronic kidney disease: Secondary | ICD-10-CM | POA: Diagnosis not present

## 2024-02-06 DIAGNOSIS — E1169 Type 2 diabetes mellitus with other specified complication: Secondary | ICD-10-CM | POA: Diagnosis not present

## 2024-02-06 DIAGNOSIS — Z6838 Body mass index (BMI) 38.0-38.9, adult: Secondary | ICD-10-CM | POA: Diagnosis not present

## 2024-02-07 DIAGNOSIS — Z961 Presence of intraocular lens: Secondary | ICD-10-CM | POA: Diagnosis not present

## 2024-02-07 DIAGNOSIS — E113293 Type 2 diabetes mellitus with mild nonproliferative diabetic retinopathy without macular edema, bilateral: Secondary | ICD-10-CM | POA: Diagnosis not present

## 2024-02-20 ENCOUNTER — Ambulatory Visit (INDEPENDENT_AMBULATORY_CARE_PROVIDER_SITE_OTHER)

## 2024-02-20 DIAGNOSIS — I10 Essential (primary) hypertension: Secondary | ICD-10-CM

## 2024-02-20 DIAGNOSIS — E1165 Type 2 diabetes mellitus with hyperglycemia: Secondary | ICD-10-CM

## 2024-02-20 NOTE — Progress Notes (Signed)
 02/20/2024 Name: Tony Lamm Delfin Jr. MRN: [id number] DOB: 03/18/1941  Chief Complaint  Patient presents with   Medication Management   Diabetes   Hypertension    Tony Russell. is a 83 y.o. year old male who presented for a telephone visit.   They were referred to the pharmacist by their PCP for assistance in managing diabetes, medication access, and complex medication management.    Subjective:  Care Team: Primary Care Provider: Ozell Heron HERO, MD ; Next Scheduled Visit: 04/21/24  Medication Access/Adherence  Current Pharmacy:  [id number] [address], [place] - [address] AT [facility] [address] [place] [address] Phone: [phone] Fax: [phone]   Patient reports affordability concerns with their medications: Not with his current medications, had issues with cost of newer DM therapies in the past Patient reports access/transportation concerns to their pharmacy: No  Patient reports adherence concerns with their medications:  Yes  - has self-adjusted meds in the past if he didn't feel like he was responding well   Diabetes:  Current medications: Glipizide  10mg  BID (taking 1 tab daily, did not like the way evening tab made him feel), Farxiga  10mg  daily Medications tried in the past: Metformin , Lantus   Current glucose readings: None - has not been able to check. Brings in BG testing supplies. Strips are not compatible with his Accu Chek Guide Me Meter. Patient never picked up testing supplies, [facility] and [facility]. Patient plans to pick up today.    Observed patterns:  Patient denies hypoglycemic s/sx including dizziness, shakiness, sweating. Patient denies hyperglycemic symptoms including polyuria, polydipsia, polyphagia, nocturia, neuropathy, blurred vision.   Hyperlipidemia/ASCVD Risk Reduction  Current lipid lowering medications: Atorvastatin  40mg   Medications tried in  the past: atorvastatin  in past stopped due to myopathy but patient reports this lingered well after stopping the medication, open to a retrial at last PCP visit  Antiplatelet regimen: None  Hypertension:  Current medications: Telmisartan  80mg  Medications previously tried: None  Patient has a validated, automated, upper arm home BP cuff Current blood pressure readings readings:BP 180/102, patient declines ER. Denies any symptoms of BP   Patient denies hypotensive s/sx including dizziness, lightheadedness.  Patient denies hypertensive symptoms including headache, chest pain, shortness of breath    Objective:  Lab Results  Component Value Date   HGBA1C 8.9 (A) 11/20/2023    Lab Results  Component Value Date   CREATININE 1.20 11/20/2023   BUN 15 11/20/2023   NA 134 (L) 11/20/2023   K 4.3 11/20/2023   CL 100 11/20/2023   CO2 30 11/20/2023    Lab Results  Component Value Date   CHOL 207 (H) 11/20/2023   HDL 31.00 (L) 11/20/2023   LDLCALC 120 (H) 11/20/2023   LDLDIRECT 145.0 12/15/2022   TRIG 280.0 (H) 11/20/2023   CHOLHDL 7 11/20/2023    Medications Reviewed Today     Reviewed by Lionell Jon DEL, RPH (Pharmacist) on 02/20/24 at 0919  Med List Status: <None>   Medication Order Taking? Sig Documenting Provider Last Dose Status Informant  Accu-Chek Softclix Lancets lancets [id number]  Use to check blood sugar once daily Dx: E11.65 Ozell Heron HERO, MD  Active   atorvastatin  (LIPITOR) 40 MG tablet [id number]  Take 1 tablet (40 mg total) by mouth daily. Ozell Heron HERO, MD  Active   Blood Glucose Monitoring Suppl (ACCU-CHEK GUIDE ME) w/Device KIT [id number]  Use to check blood sugar once  daily Dx: E11.65 Please substitute to any brand preferred by insurance if needed Ozell Heron HERO, MD  Active   dapagliflozin  propanediol (FARXIGA ) 10 MG TABS tablet [id number]  Take 10 mg by mouth daily. [provider]  Active   dextromethorphan -guaiFENesin  (MUCINEX  DM)  30-600 MG 12hr tablet [id number]  Take 1 tablet by mouth 2 (two) times daily. Ozell Heron HERO, MD  Active   glipiZIDE  (GLUCOTROL ) 10 MG tablet [id number]  Take 1 tablet (10 mg total) by mouth 2 (two) times daily before a meal.  Patient taking differently: Take 10 mg by mouth 2 (two) times daily before a meal. Taking 1 tab daily   Ozell Heron HERO, MD  Active   glucose blood (ACCU-CHEK GUIDE TEST) test strip [id number]  Use to check blood sugar once daily Dx: E11.65 Ozell Heron HERO, MD  Active   telmisartan  (MICARDIS ) 80 MG tablet [id number]  Take 1 tablet (80 mg total) by mouth daily. Ozell Heron HERO, MD  Active               Assessment/Plan:   Diabetes: - Currently uncontrolled - Reviewed long term cardiovascular and renal outcomes of uncontrolled blood sugar - Reviewed goal A1c, goal fasting, and goal 2 hour post prandial glucose - Reviewed dietary modifications including low carb diet - Continue current medication therapy. Pick up new testing supplies and check BG once daily at varying times - Patient denies personal or family history of multiple endocrine neoplasia type 2, medullary thyroid cancer; personal history of pancreatitis or gallbladder disease.    Hyperlipidemia/ASCVD Risk Reduction: - Currently uncontrolled.  - Reviewed long term complications of uncontrolled cholesterol -Continue current medication therapy, get updated lipid panel at next PCP visit   Hypertension: - Currently uncontrolled - Reviewed long term cardiovascular and renal outcomes of uncontrolled blood pressure - Reviewed appropriate blood pressure monitoring technique and reviewed goal blood pressure. Recommended to check home blood pressure and heart rate daily, bring log to follow up -Recommend starting amlodipine. Patient declines today and wants to continue to work on lifestyle changes. Counseled that BP this elevated will likely need additional BP therapy, patient still  declines.    Follow Up Plan: 03/05/24  [name], PharmD Clinical Pharmacist [phone]

## 2024-02-26 ENCOUNTER — Other Ambulatory Visit: Payer: Self-pay | Admitting: Family Medicine

## 2024-02-26 ENCOUNTER — Telehealth: Payer: Self-pay

## 2024-02-26 DIAGNOSIS — E1165 Type 2 diabetes mellitus with hyperglycemia: Secondary | ICD-10-CM

## 2024-02-26 MED ORDER — ACCU-CHEK GUIDE ME W/DEVICE KIT: PACK | 0 refills | Status: AC

## 2024-02-26 NOTE — Progress Notes (Signed)
   02/26/2024  Patient ID: Tony Russell., male   DOB: May 30, 1941, 83 y.o.   MRN: [id number]  Patient called in stating that he cannot find his accu chek guide me meter. Resending rx to pharmacy with free voucher coupon attached. Patient instructed to call back with any issues picking up.  [name], PharmD Clinical Pharmacist [phone]

## 2024-03-05 ENCOUNTER — Ambulatory Visit

## 2024-03-05 VITALS — Wt 243.2 lb

## 2024-03-05 DIAGNOSIS — I10 Essential (primary) hypertension: Secondary | ICD-10-CM

## 2024-03-05 DIAGNOSIS — E1165 Type 2 diabetes mellitus with hyperglycemia: Secondary | ICD-10-CM

## 2024-03-05 NOTE — Progress Notes (Signed)
 03/05/2024 Name: Tony Hinton Fluhr Jr. MRN: [id number] DOB: 1941/03/11  Chief Complaint  Patient presents with   Medication Management   Diabetes   Hypertension    [name] is a 83 y.o. year old male who presented for a telephone visit.   They were referred to the pharmacist by their PCP for assistance in managing diabetes, medication access, and complex medication management.    Subjective:  Care Team: Primary Care Provider: Ozell Russell HERO, MD ; Next Scheduled Visit: 04/21/24  Medication Access/Adherence  Current Pharmacy:  [id number] [address], [place] - [address] AT [facility] [address] [place] [address] Phone: [phone] Fax: [phone]   Patient reports affordability concerns with their medications: Not with his current medications, had issues with cost of newer DM therapies in the past Patient reports access/transportation concerns to their pharmacy: No  Patient reports adherence concerns with their medications:  Yes  - has self-adjusted meds in the past if he didn't feel like he was responding well   Diabetes:  Current medications: Glipizide  10mg  BID (taking 1 tab daily, did not like the way evening tab made him feel), Farxiga  10mg  daily Medications tried in the past: Metformin , Lantus   Current glucose readings: None - has not been able to check. Brings in BG testing supplies. Was able to get a fasting BG of 126. Patient had not been using the test strips properly. Reviewed and he expressed understanding.    Observed patterns:  Patient denies hypoglycemic s/sx including dizziness, shakiness, sweating. Patient denies hyperglycemic symptoms including polyuria, polydipsia, polyphagia, nocturia, neuropathy, blurred vision.   Hyperlipidemia/ASCVD Risk Reduction  Current lipid lowering medications: Atorvastatin  40mg   Medications tried in the past: None  Antiplatelet regimen:  None  Hypertension:  Current medications: Telmisartan  80mg  Medications previously tried: None  Patient has a validated, automated, upper arm home BP cuff Current blood pressure readings readings:BP 154/84 has not had BP meds this morning  Did not bring BP machine with him from home, has not been checking   Patient denies hypotensive s/sx including dizziness, lightheadedness.  Patient denies hypertensive symptoms including headache, chest pain, shortness of breath    Objective:  Lab Results  Component Value Date   HGBA1C 8.9 (A) 11/20/2023    Lab Results  Component Value Date   CREATININE 1.20 11/20/2023   BUN 15 11/20/2023   NA 134 (L) 11/20/2023   K 4.3 11/20/2023   CL 100 11/20/2023   CO2 30 11/20/2023    Lab Results  Component Value Date   CHOL 207 (H) 11/20/2023   HDL 31.00 (L) 11/20/2023   LDLCALC 120 (H) 11/20/2023   LDLDIRECT 145.0 12/15/2022   TRIG 280.0 (H) 11/20/2023   CHOLHDL 7 11/20/2023    Medications Reviewed Today     Reviewed by Lionell Jon DEL, RPH (Pharmacist) on 03/05/24 at 1024  Med List Status: <None>   Medication Order Taking? Sig Documenting Provider Last Dose Status Informant  Accu-Chek Softclix Lancets lancets [id number]  Use to check blood sugar once daily Dx: E11.65 Tony Russell HERO, MD  Active   atorvastatin  (LIPITOR) 40 MG tablet [id number] Yes Take 1 tablet (40 mg total) by mouth daily. Tony Russell HERO, MD  Active   Blood Glucose Monitoring Suppl (ACCU-CHEK GUIDE ME) w/Device KIT [id number]  Use to check blood sugar once daily. Use free voucher coupon: BIN [id number] PCN: [id number] Group: [id number]  ID: [id number] Tony Russell HERO, MD  Active  dapagliflozin  propanediol (FARXIGA ) 10 MG TABS tablet [id number] Yes Take 10 mg by mouth daily. [provider]  Active   dextromethorphan -guaiFENesin  (MUCINEX  DM) 30-600 MG 12hr tablet [id number]  Take 1 tablet by mouth 2 (two) times daily. Tony Russell HERO, MD  Active   glipiZIDE   (GLUCOTROL ) 10 MG tablet [id number] Yes TAKE 1 TABLET (10 MG TOTAL) BY MOUTH TWICE A DAY BEFORE A MEAL  Patient taking differently: 1 tab daily in morning with breakfast   Tony Russell HERO, MD  Active   glucose blood (ACCU-CHEK GUIDE TEST) test strip [id number]  Use to check blood sugar once daily Dx: E11.65 Tony Russell HERO, MD  Active   telmisartan  (MICARDIS ) 80 MG tablet [id number] Yes Take 1 tablet (80 mg total) by mouth daily. Tony Russell HERO, MD  Active               Assessment/Plan:   Diabetes: - Currently uncontrolled - Reviewed long term cardiovascular and renal outcomes of uncontrolled blood sugar - Reviewed goal A1c, goal fasting, and goal 2 hour post prandial glucose - Reviewed dietary modifications including low carb diet - Continue current medication therapy. Pick up new testing supplies and check BG once daily at varying times - Patient denies personal or family history of multiple endocrine neoplasia type 2, medullary thyroid cancer; personal history of pancreatitis or gallbladder disease.    Hyperlipidemia/ASCVD Risk Reduction: - Currently uncontrolled.  - Reviewed long term complications of uncontrolled cholesterol -Continue current medication therapy, get updated lipid panel at next PCP visit   Hypertension: - Currently uncontrolled - Reviewed long term cardiovascular and renal outcomes of uncontrolled blood pressure - Reviewed appropriate blood pressure monitoring technique and reviewed goal blood pressure. Recommended to check home blood pressure and heart rate daily, bring log to follow up -Continue current med therapy. Consider addition of amlodipine if BP remains elevated.     Follow Up Plan: 03/17/24  [name], PharmD Clinical Pharmacist [phone]

## 2024-03-17 ENCOUNTER — Ambulatory Visit

## 2024-03-17 DIAGNOSIS — E1165 Type 2 diabetes mellitus with hyperglycemia: Secondary | ICD-10-CM

## 2024-03-17 DIAGNOSIS — I10 Essential (primary) hypertension: Secondary | ICD-10-CM

## 2024-03-17 NOTE — Progress Notes (Signed)
 03/17/2024 Name: Tony Biggins Rotenberg Jr. MRN: [id number] DOB: 1941/01/09  Chief Complaint  Patient presents with   Medication Management   Diabetes   Hypertension    Tony Mcginley. is a 83 y.o. year old male who presented for an office visit.   They were referred to the pharmacist by their PCP for assistance in managing diabetes, medication access, and complex medication management.    Subjective:  Care Team: Primary Care Provider: Ozell Heron HERO, Russell ; Next Scheduled Visit: 04/21/24  Medication Access/Adherence  Current Pharmacy:  [id number] [address], [place] - [address] AT [facility] [address] [place] [address] Phone: [phone] Fax: [phone]   Patient reports affordability concerns with their medications: Not with his current medications, had issues with cost of newer DM therapies in the past Patient reports access/transportation concerns to their pharmacy: No  Patient reports adherence concerns with their medications:  Yes  - has self-adjusted meds in the past if he didn't feel like he was responding well   Diabetes:  Current medications: Glipizide  10mg  BID (taking 1 tab daily, did not like the way evening tab made him feel), Farxiga  10mg  daily Medications tried in the past: Metformin , Lantus   Current glucose readings: None - has not been able to check. Brings in BG testing supplies. Was able to get a fasting BG of 126 today. Patient had not been using the test strips properly. Reviewed and he expressed understanding.    Observed patterns:  Patient denies hypoglycemic s/sx including dizziness, shakiness, sweating. Patient denies hyperglycemic symptoms including polyuria, polydipsia, polyphagia, nocturia, neuropathy, blurred vision.   Hyperlipidemia/ASCVD Risk Reduction  Current lipid lowering medications: Atorvastatin  40mg   Medications tried in the past: None  Antiplatelet regimen:  None  Hypertension:  Current medications: Telmisartan  80mg  Medications previously tried: None  Patient has a validated, automated, upper arm home BP cuff Current blood pressure readings readings: BP 169/100 in office. Home readings show readings ranging from 127-145/70s. All readings except 2 had SBP below 140.    Patient denies hypotensive s/sx including dizziness, lightheadedness.  Patient denies hypertensive symptoms including headache, chest pain, shortness of breath    Objective:  Lab Results  Component Value Date   HGBA1C 8.9 (A) 11/20/2023    Lab Results  Component Value Date   CREATININE 1.20 11/20/2023   BUN 15 11/20/2023   NA 134 (L) 11/20/2023   K 4.3 11/20/2023   CL 100 11/20/2023   CO2 30 11/20/2023    Lab Results  Component Value Date   CHOL 207 (H) 11/20/2023   HDL 31.00 (L) 11/20/2023   LDLCALC 120 (H) 11/20/2023   LDLDIRECT 145.0 12/15/2022   TRIG 280.0 (H) 11/20/2023   CHOLHDL 7 11/20/2023    Medications Reviewed Today     Reviewed by Lionell Jon DEL, RPH (Pharmacist) on 03/17/24 at 1517  Med List Status: <None>   Medication Order Taking? Sig Documenting Provider Last Dose Status Informant  Accu-Chek Softclix Lancets lancets [id number]  Use to check blood sugar once daily Dx: E11.65 Tony Russell  Active   atorvastatin  (LIPITOR) 40 MG tablet [id number] Yes Take 1 tablet (40 mg total) by mouth daily. Tony Russell  Active   Blood Glucose Monitoring Suppl (ACCU-CHEK GUIDE ME) w/Device KIT [id number]  Use to check blood sugar once daily. Use free voucher coupon: BIN [id number] PCN: [id number] Group: [id number]  ID: [id number] Tony Russell  Active   dapagliflozin   propanediol (FARXIGA ) 10 MG TABS tablet [id number] Yes Take 10 mg by mouth daily. Provider, Historical, Russell  Active   dextromethorphan -guaiFENesin  (MUCINEX  DM) 30-600 MG 12hr tablet [id number]  Take 1 tablet by mouth 2 (two) times daily. Tony Russell  Active   glipiZIDE   (GLUCOTROL ) 10 MG tablet [id number]  TAKE 1 TABLET (10 MG TOTAL) BY MOUTH TWICE A DAY BEFORE A MEAL  Patient taking differently: 1 tab daily in morning with breakfast   Tony Russell  Active   glucose blood (ACCU-CHEK GUIDE TEST) test strip [id number]  Use to check blood sugar once daily Dx: E11.65 Tony Russell  Active   telmisartan  (MICARDIS ) 80 MG tablet [id number] Yes Take 1 tablet (80 mg total) by mouth daily. Tony Russell  Active               Assessment/Plan:   Diabetes: - Currently uncontrolled - Reviewed long term cardiovascular and renal outcomes of uncontrolled blood sugar - Reviewed goal A1c, goal fasting, and goal 2 hour post prandial glucose - Reviewed dietary modifications including low carb diet - Continue current medication therapy. check BG once daily at varying times - Patient denies personal or family history of multiple endocrine neoplasia type 2, medullary thyroid cancer; personal history of pancreatitis or gallbladder disease.    Hyperlipidemia/ASCVD Risk Reduction: - Currently uncontrolled.  - Reviewed long term complications of uncontrolled cholesterol -Continue current medication therapy, get updated lipid panel at next PCP visit   Hypertension: - Currently uncontrolled - Reviewed long term cardiovascular and renal outcomes of uncontrolled blood pressure - Reviewed appropriate blood pressure monitoring technique and reviewed goal blood pressure. Recommended to check home blood pressure and heart rate daily, bring log to follow up -Continue current med therapy. Consider addition of amlodipine if BP begins to elevate at home.    Follow Up Plan: 03/31/24  [name], PharmD Clinical Pharmacist [phone]

## 2024-03-25 ENCOUNTER — Telehealth: Payer: Self-pay

## 2024-03-25 NOTE — Telephone Encounter (Signed)
 Patient was identified as falling into the [place] Measure - Diabetes.   Patient was: Appointment already scheduled for:  04/21/2024 with Dr. Ozell.

## 2024-03-31 ENCOUNTER — Ambulatory Visit

## 2024-03-31 DIAGNOSIS — I10 Essential (primary) hypertension: Secondary | ICD-10-CM

## 2024-03-31 DIAGNOSIS — E1165 Type 2 diabetes mellitus with hyperglycemia: Secondary | ICD-10-CM

## 2024-03-31 NOTE — Progress Notes (Signed)
 03/31/2024 Name: Tony Every Zapien Jr. MRN: [id number] DOB: [birth year]  Chief Complaint  Patient presents with   Medication Management   Diabetes   Hypertension    Tony Russell. is a 83 y.o. year old male who presented for an office visit.   They were referred to the pharmacist by their PCP for assistance in managing diabetes, medication access, and complex medication management.    Subjective:  Care Team: Primary Care Provider: Ozell Heron HERO, MD ; Next Scheduled Visit: 04/21/24  Medication Access/Adherence  Current Pharmacy:  [id number] [address], [place] - [address] AT [facility] [address] [place] [address] Phone: [phone] Fax: [phone]   Patient reports affordability concerns with their medications: Not with his current medications, had issues with cost of newer DM therapies in the past Patient reports access/transportation concerns to their pharmacy: No  Patient reports adherence concerns with their medications:  Yes  - has self-adjusted meds in the past if he didn't feel like he was responding well   Diabetes:  Current medications: Glipizide  10mg  BID (taking 1 tab daily, did not like the way evening tab made him feel), Farxiga  10mg  daily Medications tried in the past: Metformin , Lantus   Current glucose readings:  Only 2 readings since last in office visit 166 on 10/25 fasting 211 on 10/20, patient cannot recall if this was fasting or not   Observed patterns:  Patient denies hypoglycemic s/sx including dizziness, shakiness, sweating. Patient denies hyperglycemic symptoms including polyuria, polydipsia, polyphagia, nocturia, neuropathy, blurred vision.   Hyperlipidemia/ASCVD Risk Reduction  Current lipid lowering medications: Atorvastatin  40mg   Medications tried in the past: None  Antiplatelet regimen: None  Hypertension:  Current medications: Telmisartan  80mg  Medications previously tried:  None  Patient has a validated, automated, upper arm home BP cuff Current blood pressure readings readings: BP 148/78 in office, does not come down at recheck. Reports he did oversleep this morning and has not had his meds like he usually would have  Home machine showing readings ranging from 125-149/70s-80s    Patient denies hypotensive s/sx including dizziness, lightheadedness.  Patient denies hypertensive symptoms including headache, chest pain, shortness of breath    Objective:  Lab Results  Component Value Date   HGBA1C 8.9 (A) 11/20/2023    Lab Results  Component Value Date   CREATININE 1.20 11/20/2023   BUN 15 11/20/2023   NA 134 (L) 11/20/2023   K 4.3 11/20/2023   CL 100 11/20/2023   CO2 30 11/20/2023    Lab Results  Component Value Date   CHOL 207 (H) 11/20/2023   HDL 31.00 (L) 11/20/2023   LDLCALC 120 (H) 11/20/2023   LDLDIRECT 145.0 12/15/2022   TRIG 280.0 (H) 11/20/2023   CHOLHDL 7 11/20/2023    Medications Reviewed Today     Reviewed by Lionell Jon DEL, RPH (Pharmacist) on 03/31/24 at 0926  Med List Status: <None>   Medication Order Taking? Sig Documenting Provider Last Dose Status Informant  Accu-Chek Softclix Lancets lancets [id number]  Use to check blood sugar once daily Dx: E11.65 Ozell Heron HERO, MD  Active   atorvastatin  (LIPITOR) 40 MG tablet [id number]  Take 1 tablet (40 mg total) by mouth daily. Ozell Heron HERO, MD  Active   Blood Glucose Monitoring Suppl (ACCU-CHEK GUIDE ME) w/Device KIT [id number]  Use to check blood sugar once daily. Use free voucher coupon: BIN [id number] PCN: [id number] Group: [id number]  ID: [id number] Ozell Heron HERO, MD  Active  dapagliflozin  propanediol (FARXIGA ) 10 MG TABS tablet [id number]  Take 10 mg by mouth daily. [provider]  Active   dextromethorphan -guaiFENesin  (MUCINEX  DM) 30-600 MG 12hr tablet [id number]  Take 1 tablet by mouth 2 (two) times daily. Ozell Heron HERO, MD  Active   glipiZIDE  (GLUCOTROL )  10 MG tablet [id number]  TAKE 1 TABLET (10 MG TOTAL) BY MOUTH TWICE A DAY BEFORE A MEAL  Patient taking differently: 1 tab daily in morning with breakfast   Ozell Heron HERO, MD  Active   glucose blood (ACCU-CHEK GUIDE TEST) test strip [id number]  Use to check blood sugar once daily Dx: E11.65 Ozell Heron HERO, MD  Active   telmisartan  (MICARDIS ) 80 MG tablet [id number]  Take 1 tablet (80 mg total) by mouth daily. Ozell Heron HERO, MD  Active               Assessment/Plan:   Diabetes: - Currently uncontrolled - Reviewed long term cardiovascular and renal outcomes of uncontrolled blood sugar - Reviewed goal A1c, goal fasting, and goal 2 hour post prandial glucose - Reviewed dietary modifications including low carb diet - Continue current medication therapy. check BG once daily at varying times - Patient denies personal or family history of multiple endocrine neoplasia type 2, medullary thyroid cancer; personal history of pancreatitis or gallbladder disease.    Hyperlipidemia/ASCVD Risk Reduction: - Currently uncontrolled.  - Reviewed long term complications of uncontrolled cholesterol -Continue current medication therapy, get updated lipid panel at next PCP visit   Hypertension: - Currently uncontrolled - Reviewed long term cardiovascular and renal outcomes of uncontrolled blood pressure - Reviewed appropriate blood pressure monitoring technique and reviewed goal blood pressure. Recommended to check home blood pressure and heart rate daily, bring log to follow up -Continue current med therapy. Consider addition of amlodipine if BP begins to elevate at home.    Follow Up Plan: 05/19/24  [name], PharmD Clinical Pharmacist [phone]

## 2024-04-18 ENCOUNTER — Other Ambulatory Visit (HOSPITAL_COMMUNITY): Payer: Self-pay

## 2024-04-18 NOTE — Telephone Encounter (Signed)
 Received approval letter [employer] Farxiga  thru 06/04/2025 approval letter index.

## 2024-04-21 ENCOUNTER — Encounter: Payer: Self-pay | Admitting: Family Medicine

## 2024-04-21 ENCOUNTER — Ambulatory Visit: Admitting: Family Medicine

## 2024-04-21 VITALS — BP 150/80 | HR 97 | Temp 98.1°F | Ht 68.0 in | Wt 246.4 lb

## 2024-04-21 DIAGNOSIS — E1165 Type 2 diabetes mellitus with hyperglycemia: Secondary | ICD-10-CM

## 2024-04-21 DIAGNOSIS — I10 Essential (primary) hypertension: Secondary | ICD-10-CM

## 2024-04-21 DIAGNOSIS — Z7984 Long term (current) use of oral hypoglycemic drugs: Secondary | ICD-10-CM

## 2024-04-21 DIAGNOSIS — E782 Mixed hyperlipidemia: Secondary | ICD-10-CM

## 2024-04-21 LAB — POCT GLYCOSYLATED HEMOGLOBIN (HGB A1C): Hemoglobin A1C: 7.5 % — AB (ref 4.0–5.6)

## 2024-04-21 MED ORDER — ATORVASTATIN CALCIUM 40 MG PO TABS: 40.0000 mg | ORAL_TABLET | Freq: Every day | ORAL | 1 refills | Status: AC

## 2024-04-21 NOTE — Progress Notes (Signed)
 Established Patient Office Visit  Subjective   Patient ID: Tony Gerads., male    DOB: 13-Nov-1940  Age: 83 y.o. MRN: [id number]  Chief Complaint  Patient presents with   Medical Management of Chronic Issues    HPI Discussed the use of AI scribe software for clinical note transcription with the patient, who gave verbal consent to proceed.  History of Present Illness   Tony Kinsella. is an 83 year old male with diabetes who presents for a follow-up visit.  He has experienced significant improvement in leg swelling since the last visit in August. He did not take his blood pressure medication this morning. His diabetes management is progressing well, with a current A1c of 7.5. He is taking Farxiga  and glipizide  for diabetes. No numbness, burning, or tingling in his feet, although he barely feels some sensations. He is currently on Lipitor for cholesterol management.       Current Outpatient Medications  Medication Instructions   Accu-Chek Softclix Lancets lancets Use to check blood sugar once daily Dx: E11.65   atorvastatin  (LIPITOR) 40 mg, Oral, Daily   Blood Glucose Monitoring Suppl (ACCU-CHEK GUIDE ME) w/Device KIT Use to check blood sugar once daily. 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     Objective:     BP (!) 150/80   Pulse 97   Temp 98.1 F (36.7 C) (Oral)   Ht 5' 8 (1.727 m)   Wt 246 lb 6.4 oz (111.8 kg)   SpO2 98%   BMI 37.46 kg/m    Physical Exam Vitals reviewed.  Constitutional:      Appearance: Normal appearance. He is well-groomed and normal weight.  Cardiovascular:     Rate and Rhythm: Normal rate and regular rhythm.     Heart sounds: S1 normal and S2 normal. No murmur heard. Pulmonary:     Effort: Pulmonary effort is normal.     Breath sounds: Normal breath sounds and air entry. No rales.  Musculoskeletal:     Right lower leg: No edema.     Left lower leg: No edema.  Neurological:     General: No focal deficit present.     Mental Status: He is alert and oriented to person, place, and time.     Gait: Gait is intact.  Psychiatric:        Mood and Affect: Mood and affect normal.    Diabetic Foot Exam - Simple   Simple Foot Form Diabetic Foot exam was performed with the following findings: Yes 04/21/2024 10:47 AM  Visual Inspection No deformities, no ulcerations, no other skin breakdown bilaterally: Yes Sensation Testing  Intact to touch and monofilament testing bilaterally: Yes Pulse Check Posterior Tibialis and Dorsalis pulse intact bilaterally: Yes Comments       Results for orders placed or performed in visit on 04/21/24  POC HgB A1c  Result Value Ref Range   Hemoglobin A1C 7.5 (A) 4.0 - 5.6 %   HbA1c POC (<> result, manual entry)     HbA1c, POC (prediabetic range)     HbA1c, POC (controlled diabetic range)        The ASCVD Risk score (Arnett DK, et al., 2019) failed to calculate for the following reasons:   The 2019 ASCVD risk score is only valid for ages 36 to 37    Assessment & Plan:  Type 2 diabetes mellitus with hyperglycemia, without long-term current use of insulin  (HCC) -     POCT  glycosylated hemoglobin (Hb A1C) -     Collection capillary blood specimen  Mixed hyperlipidemia -     Atorvastatin  Calcium ; Take 1 tablet (40 mg total) by mouth daily.  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 Farxiga  is effective in managing blood glucose levels and providing renal protection. Glipizide  is still being used, but there is a discussion about transitioning to Ozempic for additional cardiovascular and renal protection. Diabetic polyneuropathy is present with reduced sensation in the feet, but no numbness, burning, or tingling reported. - Continue Farxiga  and glipizide  as prescribed. - Discussed potential transition to Ozempic for additional cardiovascular and renal protection. - Performed foot exam today. - Start moisturizing feet daily to address dryness and cracking.  Hypertension Blood pressure is elevated today, likely due to missed medication dose. - Take blood pressure medication as prescribed. --continue olmesartan 80 mg daily.  Mixed hyperlipidemia Currently managed with Lipitor. - Refilled Lipitor prescription.  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 No itching reported. - Start using a thick moisturizer daily to address dryness and cracking.        Return in about 6 months (around 10/19/2024) for DM, HTN.    Heron CHRISTELLA Sharper, MD

## 2024-05-19 ENCOUNTER — Ambulatory Visit

## 2024-05-19 VITALS — BP 138/78

## 2024-05-19 DIAGNOSIS — E1165 Type 2 diabetes mellitus with hyperglycemia: Secondary | ICD-10-CM

## 2024-05-19 DIAGNOSIS — I10 Essential (primary) hypertension: Secondary | ICD-10-CM

## 2024-05-19 NOTE — Progress Notes (Signed)
 05/19/2024 Name: Tony Berrie Vavrek Jr. MRN: [id number] DOB: Jul 23, 1940  Chief Complaint  Patient presents with   Medication Management   Diabetes   Hypertension    Tony Repka. is a 83 y.o. year old male who presented for an office visit.   They were referred to the pharmacist by their PCP for assistance in managing diabetes, medication access, and complex medication management.    Subjective:  Care Team: Primary Care Provider: Ozell Heron HERO, MD ; Next Scheduled Visit: 04/21/24  Medication Access/Adherence  Current Pharmacy:  [id number] [address], [place] - [address] AT [facility] [address] [place] [address] Phone: [phone] Fax: [phone]   Patient reports affordability concerns with their medications: Not with his current medications, had issues with cost of newer DM therapies in the past Patient reports access/transportation concerns to their pharmacy: No  Patient reports adherence concerns with their medications:  Yes  - has self-adjusted meds in the past if he didn't feel like he was responding well   Diabetes:  Current medications: Glipizide  10mg  BID (taking 1 tab daily, did not like the way evening tab made him feel), Farxiga  10mg  daily Medications tried in the past: Metformin , Lantus   Current glucose readings:  Has not checked at home since last office visit  Observed patterns:  Patient denies hypoglycemic s/sx including dizziness, shakiness, sweating. Patient denies hyperglycemic symptoms including polyuria, polydipsia, polyphagia, nocturia, neuropathy, blurred vision.   Hyperlipidemia/ASCVD Risk Reduction  Current lipid lowering medications: Atorvastatin  40mg   Medications tried in the past: None  Antiplatelet regimen: None  Hypertension:  Current medications: Telmisartan  80mg  Medications previously tried: None  Patient has a validated, automated, upper arm home BP cuff Current blood  pressure readings readingsReports he did oversleep this morning and has not had his meds like he usually would have  Home machine showing readings ranging from 125-149/70s-80s, mostly <140/90. Admits higher readings are linked to when he eats something he should not have or had not had his meds yet    Patient denies hypotensive s/sx including dizziness, lightheadedness.  Patient denies hypertensive symptoms including headache, chest pain, shortness of breath    Objective:  Lab Results  Component Value Date   HGBA1C 7.5 (A) 04/21/2024    Lab Results  Component Value Date   CREATININE 1.20 11/20/2023   BUN 15 11/20/2023   NA 134 (L) 11/20/2023   K 4.3 11/20/2023   CL 100 11/20/2023   CO2 30 11/20/2023    Lab Results  Component Value Date   CHOL 207 (H) 11/20/2023   HDL 31.00 (L) 11/20/2023   LDLCALC 120 (H) 11/20/2023   LDLDIRECT 145.0 12/15/2022   TRIG 280.0 (H) 11/20/2023   CHOLHDL 7 11/20/2023    Medications Reviewed Today     Reviewed by Lionell Jon DEL, RPH (Pharmacist) on 05/19/24 at 0900  Med List Status: <None>   Medication Order Taking? Sig Documenting Provider Last Dose Status Informant  Accu-Chek Softclix Lancets lancets [id number] Yes Use to check blood sugar once daily Dx: E11.65 Tony Heron HERO, MD  Active   atorvastatin  (LIPITOR) 40 MG tablet [id number] Yes Take 1 tablet (40 mg total) by mouth daily. Tony Heron HERO, MD  Active   Blood Glucose Monitoring Suppl (ACCU-CHEK GUIDE ME) w/Device KIT [id number]  Use to check blood sugar once daily. Use free voucher coupon: BIN [id number] PCN: [id number] Group: [id number]  ID: [id number] Tony Heron HERO, MD  Active   dapagliflozin  propanediol (  FARXIGA ) 10 MG TABS tablet [id number] Yes Take 10 mg by mouth daily. [provider]  Active   dextromethorphan -guaiFENesin  (MUCINEX  DM) 30-600 MG 12hr tablet [id number]  Take 1 tablet by mouth 2 (two) times daily. Tony Heron HERO, MD  Active   glipiZIDE  (GLUCOTROL ) 10  MG tablet [id number]  TAKE 1 TABLET (10 MG TOTAL) BY MOUTH TWICE A DAY BEFORE A MEAL  Patient taking differently: 1 tab daily in morning with breakfast   Tony Heron HERO, MD  Active   glucose blood (ACCU-CHEK GUIDE TEST) test strip [id number]  Use to check blood sugar once daily Dx: E11.65 Tony Heron HERO, MD  Active   telmisartan  (MICARDIS ) 80 MG tablet [id number] Yes Take 1 tablet (80 mg total) by mouth daily. Tony Heron HERO, MD  Active               Assessment/Plan:   Diabetes: - Currently uncontrolled but improving - Reviewed long term cardiovascular and renal outcomes of uncontrolled blood sugar - Reviewed goal A1c, goal fasting, and goal 2 hour post prandial glucose - Reviewed dietary modifications including low carb diet - Continue current medication therapy. check BG once daily at varying times. Consider increase on glipizide  or additon of GLP-1 if A1c still elevated at next check or if home BG is elevated - Patient denies personal or family history of multiple endocrine neoplasia type 2, medullary thyroid cancer; personal history of pancreatitis or gallbladder disease.    Hyperlipidemia/ASCVD Risk Reduction: - Currently uncontrolled.  - Reviewed long term complications of uncontrolled cholesterol -Continue current medication therapy, get updated lipid panel at next PCP visit   Hypertension: - Currently uncontrolled - Reviewed long term cardiovascular and renal outcomes of uncontrolled blood pressure - Reviewed appropriate blood pressure monitoring technique and reviewed goal blood pressure. Recommended to check home blood pressure and heart rate daily, bring log to follow up -Continue current med therapy. Consider addition of amlodipine if BP begins to elevate at home.    Follow Up Plan: 3 months  [name], PharmD Clinical Pharmacist [phone]

## 2024-05-21 ENCOUNTER — Ambulatory Visit: Admitting: Family Medicine

## 2024-06-26 ENCOUNTER — Other Ambulatory Visit: Payer: Self-pay | Admitting: Family Medicine

## 2024-06-26 DIAGNOSIS — I10 Essential (primary) hypertension: Secondary | ICD-10-CM

## 2024-07-09 ENCOUNTER — Other Ambulatory Visit: Payer: Self-pay | Admitting: Family Medicine

## 2024-07-09 DIAGNOSIS — E1165 Type 2 diabetes mellitus with hyperglycemia: Secondary | ICD-10-CM

## 2024-08-04 ENCOUNTER — Ambulatory Visit

## 2024-10-20 ENCOUNTER — Ambulatory Visit: Admitting: Family Medicine
